# Patient Record
Sex: Female | Born: 1970 | Race: White | Hispanic: No | Marital: Married | Smoking: Never smoker
Health system: Southern US, Community
[De-identification: ages and names within clinical notes are randomized; demographics above are authoritative.]

---

## 1997-11-12 HISTORY — PX: TUBAL LIGATION: SHX77

## 2006-12-13 HISTORY — PX: KNEE SURGERY: SHX244

## 2010-07-28 ENCOUNTER — Ambulatory Visit: Payer: Self-pay | Admitting: Family Medicine

## 2010-07-28 DIAGNOSIS — F39 Unspecified mood [affective] disorder: Secondary | ICD-10-CM | POA: Insufficient documentation

## 2010-07-28 DIAGNOSIS — R634 Abnormal weight loss: Secondary | ICD-10-CM

## 2010-08-18 ENCOUNTER — Ambulatory Visit: Payer: Self-pay | Admitting: Family Medicine

## 2010-08-18 ENCOUNTER — Other Ambulatory Visit: Admission: RE | Admit: 2010-08-18 | Discharge: 2010-08-18 | Payer: Self-pay | Admitting: Family Medicine

## 2010-08-27 LAB — CONVERTED CEMR LAB
Pap Smear: NEGATIVE
Pap Smear: NORMAL

## 2010-11-02 ENCOUNTER — Telehealth: Payer: Self-pay | Admitting: Family Medicine

## 2011-01-12 NOTE — Progress Notes (Signed)
Summary: Meds  Phone Note Call from Patient Call back at Home Phone [phone]   Caller: Patient Call For: Nani Gasser MD Summary of Call: Pt calls and would like a call back in regards to her refills on meds.  Initial call taken by: Kathlene November LPN,  November 02, 2010 9:04 AM  Follow-up for Phone Call        spoke with pt Follow-up by: Avon Gully CMA, Duncan Dull),  November 02, 2010 11:27 AM

## 2011-01-12 NOTE — Letter (Signed)
Summary: Depression Questionnaire  Depression Questionnaire   Imported By: Lanelle Bal 08/04/2010 11:29:14  _____________________________________________________________________  External Attachment:    Type:   Image     Comment:   External Document
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
## 2011-01-12 NOTE — Assessment & Plan Note (Signed)
Summary: CPE, f/u mood   Vital Signs:  Patient profile:   40 year old female Height:      63 inches Weight:      144 pounds Pulse rate:   102 / minute BP sitting:   143 / 95  (right arm) Cuff size:   regular  Vitals Entered By: Avon Gully CMA, Duncan Dull) (August 18, 2010 4:06 PM) CC: f/u mood,pap   CC:  f/u mood and pap.  History of Present Illness: citalopram is working well now. Will stay with current dose. Felt starnge the first week but that resolved.  Has had some abnormal paps but usually repeat paps are normal. Occ cervical polyp but these have been benign too.   Current Medications (verified): 1)  Citalopram Hydrobromide 20 Mg Tabs (Citalopram Hydrobromide) .... 1/2 Tab By [employer] Daily For One Week, Thenincrease To Whole Tab Daily  Allergies (verified): No Known Drug Allergies  Comments:  Nurse/Medical Assistant: The patient's medications and allergies were reviewed with the patient and were updated in the Medication and Allergy Lists. Avon Gully CMA, Duncan Dull) (August 18, 2010 4:07 PM)  Past History:  Past Medical History: Last updated: 07/28/2010 None  Past Surgical History: Last updated: 07/28/2010 c/sec 04/1992 Tubal ligation 11/1997 Left ACL  05/2007  Family History: Reviewed history from 07/28/2010 and no changes required. 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  Lives with her son and daughter.   Never Smoked Alcohol use-no Drug use-no Regular exercise-yes, 1 hour a day 2 caffeinated drinks per day   Review of Systems  The patient denies anorexia, fever, weight loss, weight gain, vision loss, decreased hearing, hoarseness, chest pain, syncope, dyspnea on exertion, peripheral edema, prolonged cough, headaches, hemoptysis, abdominal pain, melena, hematochezia, severe indigestion/heartburn, hematuria, incontinence, genital sores, muscle weakness,  suspicious skin lesions, transient blindness, difficulty walking, depression, unusual weight change, abnormal bleeding, enlarged lymph nodes, and breast masses.    Physical Exam  General:  Well-developed,well-nourished,in no acute distress; alert,appropriate and cooperative throughout examination Head:  Normocephalic and atraumatic without obvious abnormalities. No apparent alopecia or balding. Eyes:  No corneal or conjunctival inflammation noted. EOMI. Perrla.  Ears:  External ear exam shows no significant lesions or deformities.  Otoscopic examination reveals clear canals, tympanic membranes are intact bilaterally without bulging, retraction, inflammation or discharge. Hearing is grossly normal bilaterally. Nose:  External nasal examination shows no deformity or inflammation. Nasal mucosa are pink and moist without lesions or exudates. Mouth:  Oral mucosa and oropharynx without lesions or exudates.  Teeth in good repair. Neck:  No deformities, masses, or tenderness noted. Borderline enlarged thyroid.  Chest Wall:  No deformities, masses, or tenderness noted. Breasts:  No mass, nodules, thickening, tenderness, bulging, retraction, inflamation, nipple discharge or skin changes noted.   Lungs:  Normal respiratory effort, chest expands symmetrically. Lungs are clear to auscultation, no crackles or wheezes. Heart:  Normal rate and regular rhythm. S1 and S2 normal without gallop, murmur, click, rub or other extra sounds. Abdomen:  Bowel sounds positive,abdomen soft and non-tender without masses, organomegaly or hernias noted. Genitalia:  Normal introitus for age, no external lesions, no vaginal discharge, mucosa pink and moist, no vaginal or cervical lesions, no vaginal atrophy, no friaility or hemorrhage, normal uterus size and position, no adnexal masses or tenderness Msk:  No deformity or scoliosis noted of thoracic or lumbar spine.   Pulses:  R and L carotid,radial ,  dorsalis pedis and posterior  tibial pulses are full and equal bilaterally Extremities:  No clubbing, cyanosis, edema, or deformity noted with normal full range of motion of all joints.   Neurologic:  No cranial nerve deficits noted. Station and gait are normal. DTRs are symmetrical throughout. Sensory, motor and coordinative functions appear intact. Skin:  no rashes.   Cervical Nodes:  No lymphadenopathy noted Axillary Nodes:  No palpable lymphadenopathy Psych:  Cognition and judgment appear intact. Alert and cooperative with normal attention span and concentration. No apparent delusions, illusions, hallucinations   Impression & Recommendations:  Problem # 1:  HEALTH MAINTENANCE EXAM (ICD-V70.0) Here for CPE today. Exam is normal Will f/u pap results.   Tdap is up to date. She will hold off on the flu shot today.  Encuraged regulare exercise and calcium supplementation.  Orders: T-Comprehensive Metabolic Panel [id number]) T-Lipid Profile ([id number]) T-TSH [id number])  Problem # 2:  MOOD DISORDER (ICD-296.90) PHQ-9 score was 1, and GAD-7 score is 1.  Thus she is in remission and doing well. Rec stay on the medication fo r6-12 monts.  F/U in 3-4 months Orders: T-TSH ([id number])  Complete Medication List: 1)  Citalopram Hydrobromide 20 Mg Tabs (Citalopram hydrobromide) .... 1/2 tab by mout daily for one week, thenincrease to whole tab daily  Patient Instructions: 1)  Go fasting for your labs. They are open Mon-Fri 8AM-5PM. We will call with the results 2)  We will call you in one week for your pap results 3)  Can schedule a nurse appointment when you are ready for your flu shot.  4)  It is important that you exercise reguarly at least 20 minutes 5 times a week. If you develop chest pain, have severe difficulty breathing, or feel very tired, stop exercising immediately and seek medical attention.  5)  Take calcium +vitamin D daily.   TD Result Date:  12/13/2001 TD Result:  given TD Next Due:  10 yr

## 2011-01-12 NOTE — Letter (Signed)
Summary: Anxiety & Depression Questionnaire  Anxiety & Depression Questionnaire   Imported By: Lanelle Bal 09/01/2010 13:46:52  _____________________________________________________________________  External Attachment:    Type:   Image     Comment:   External Document

## 2011-03-13 ENCOUNTER — Other Ambulatory Visit: Payer: Self-pay | Admitting: Family Medicine

## 2011-03-17 ENCOUNTER — Encounter: Payer: Self-pay | Admitting: Family Medicine

## 2011-03-18 ENCOUNTER — Ambulatory Visit: Payer: Self-pay | Admitting: Family Medicine

## 2011-05-31 ENCOUNTER — Encounter: Payer: Self-pay | Admitting: Family Medicine

## 2011-05-31 ENCOUNTER — Other Ambulatory Visit: Payer: Self-pay | Admitting: Family Medicine

## 2011-05-31 ENCOUNTER — Ambulatory Visit (INDEPENDENT_AMBULATORY_CARE_PROVIDER_SITE_OTHER): Payer: Self-pay | Admitting: Family Medicine

## 2011-05-31 DIAGNOSIS — F39 Unspecified mood [affective] disorder: Secondary | ICD-10-CM

## 2011-05-31 DIAGNOSIS — E663 Overweight: Secondary | ICD-10-CM

## 2011-05-31 DIAGNOSIS — R634 Abnormal weight loss: Secondary | ICD-10-CM

## 2011-05-31 DIAGNOSIS — R5381 Other malaise: Secondary | ICD-10-CM

## 2011-05-31 DIAGNOSIS — R5383 Other fatigue: Secondary | ICD-10-CM

## 2011-05-31 DIAGNOSIS — Z1231 Encounter for screening mammogram for malignant neoplasm of breast: Secondary | ICD-10-CM

## 2011-05-31 NOTE — Assessment & Plan Note (Signed)
Will refer for nutrition counseling

## 2011-05-31 NOTE — Progress Notes (Signed)
  Subjective:    Patient ID: Lorraine Ayers, female    DOB: [birth year], 40 y.o.   MRN: [id number]  HPI Feels really fatigued. Feels like could put her head down and she could very easily fall asleep. Was on vacation last week but felt exhausted.   Stopped her celexa abruptly about 10 days ago. Seh feels like her head is in a fishbowl.  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  Has gained weight ans she is not happy about this.  Too tired too exercise. Has noticed this for a coupel of months. No other changes. Has been a single mom for the last 6 years.  Will talk to a counselor when she feels overwhelmed.  Has had some easy bruising.  Did well on teh celexa overall. She thinks she would like to continue it for now. She noticed she has gained some weight. No chest pain or shortness of breath or change in bowels. No recent changes in her medication except for cessation of the Celexa. She has not been exercising regularly for the last 2 months. She mentioned she is also interested in seeing a nutritionist as well.    Review of Systems     Objective:   Physical Exam  Constitutional: She is oriented to person, place, and time. She appears well-developed and well-nourished.  HENT:  Head: Normocephalic and atraumatic.  Neck: Normal range of motion. Neck supple. No thyromegaly present.  Cardiovascular: Normal rate, regular rhythm and normal heart sounds.   Pulmonary/Chest: Effort normal and breath sounds normal.  Abdominal: Soft. Bowel sounds are normal. She exhibits no distension and no mass. There is no tenderness. There is no rebound and no guarding.  Lymphadenopathy:    She has no cervical adenopathy.  Neurological: She is alert and oriented to person, place, and time.  Skin: Skin is warm and dry.  Psychiatric: She has a normal mood and affect. Her behavior is normal. Judgment and thought content normal.           Assessment & Plan:  Fatigue - will check labs to mak sure no anemia. She really feels her mood is in a good place. Will check her thyroid to rule out hypothyroid.  BP is up today as well.   She would also like to be scheduled for mammogram so that it weren't for her to have that done downstairs.

## 2011-05-31 NOTE — Assessment & Plan Note (Signed)
Certainly some of her symptoms could be from abruptly stopping her Celexa. I discussed that next time we do need to wean the medication. Because she is getting ready to start a stressful job discussed different options. She would like to restart the medication and in approximately 6 months if she's doing well with her new job, she would like to try tapering off. Some of the lightheadedness that she complains about is likely from stopping the Celexa and if she restarts in the next week or 2 this should improve.

## 2011-06-01 ENCOUNTER — Telehealth: Payer: Self-pay | Admitting: Family Medicine

## 2011-06-01 LAB — CBC WITH DIFFERENTIAL/PLATELET
Basophils Absolute: 0 10*3/uL (ref 0.0–0.1)
HCT: 37.5 % (ref 36.0–46.0)
Hemoglobin: 11.5 g/dL — ABNORMAL LOW (ref 12.0–15.0)
Lymphocytes Relative: 24 % (ref 12–46)
Lymphs Abs: 1.6 10*3/uL (ref 0.7–4.0)
Monocytes Absolute: 0.4 10*3/uL (ref 0.1–1.0)
Monocytes Relative: 7 % (ref 3–12)
Neutro Abs: 4.7 10*3/uL (ref 1.7–7.7)
RBC: 4.63 MIL/uL (ref 3.87–5.11)
RDW: 14.7 % (ref 11.5–15.5)
WBC: 6.8 10*3/uL (ref 4.0–10.5)

## 2011-06-01 LAB — COMPLETE METABOLIC PANEL WITH GFR
Alkaline Phosphatase: 56 U/L (ref 39–117)
BUN: 14 mg/dL (ref 6–23)
Creat: 0.76 mg/dL (ref 0.50–1.10)
GFR, Est Non African American: >60 mL/min (ref >60)
Glucose, Bld: 94 mg/dL (ref 70–99)
Total Bilirubin: 0.5 mg/dL (ref 0.3–1.2)

## 2011-06-01 LAB — IRON AND TIBC
Iron: 35 ug/dL — ABNORMAL LOW (ref 42–145)
UIBC: 433 ug/dL

## 2011-06-01 LAB — VITAMIN B12: Vitamin B-12: 455 pg/mL (ref 211–911)

## 2011-06-01 NOTE — Telephone Encounter (Signed)
[facility] is called and per Cordelia Pen she will add FE, vit b12 and TIBC.

## 2011-06-01 NOTE — Telephone Encounter (Signed)
Call patient: Thyroid is normal. She is borderline anemic. Please try to call the lab and see if they can add on an iron and vitamin B12 level to her blood work. If not let me know and we may have to send her for a separate withdrawal.

## 2011-06-02 ENCOUNTER — Telehealth: Payer: Self-pay | Admitting: Family Medicine

## 2011-06-02 NOTE — Telephone Encounter (Signed)
Pt informed of her recent lab values.  LMOM.   start OTC iron supplement and repeat labs in 8 weeks.  Take small cup OJ when taking the iron for absorption purposes.  May need to get OTC peri colace if gets constipated.  Try to take twice daily if tolerates. Jarvis Newcomer, LPN Domingo Dimes

## 2011-06-02 NOTE — Telephone Encounter (Signed)
Call patient: Her iron level was low. Recommend starting once a day over-the-counter iron. If she takes this with a small cup of orange juice will increase absorption. She might need to take it with a stool softener if it tends to cause constipation for her. If she can take it twice a day that would be fantastic she cannot tolerate once a day that is fine. Recheck level in 8 weeks. This is most likely cause of some of her low energy and fatigue.

## 2011-06-08 ENCOUNTER — Telehealth: Payer: Self-pay | Admitting: Family Medicine

## 2011-06-08 NOTE — Telephone Encounter (Signed)
Pt called and was seen on 05-31-11.  Wants to know what the next step is with the med regimen??  Please advise. Plan:  Routed to Dr. Marlyne Beards, LPN Domingo Dimes

## 2011-06-09 ENCOUNTER — Telehealth: Payer: Self-pay | Admitting: Family Medicine

## 2011-06-09 NOTE — Telephone Encounter (Signed)
Closed

## 2011-06-09 NOTE — Telephone Encounter (Signed)
Pt made a second call and pt call returned.  Had to [facility] and this time I let pt know Dr. Linford Arnold out of the office until Monday.  Will call her then as soon as Dr. Linford Arnold has a chance to reply. Jarvis Newcomer, LPN Domingo Dimes

## 2011-06-09 NOTE — Telephone Encounter (Signed)
Please contact pt regarding nutrition consultation per Dr. Linford Arnold. If needing further info can contact Dr. Shelah Lewandowsky office.  See referral from them in chart.

## 2011-06-10 NOTE — Telephone Encounter (Signed)
Pt was notified with the following details from Dr. Cathey Endow.  LMOM for the pt. Jarvis Newcomer, LPN Domingo Dimes

## 2011-06-10 NOTE — Telephone Encounter (Signed)
I reviewed pt's chart to see if I could help.  The plan is to treat her iron deficiency for the next 8 wks to see if this helps her fatigue.  I am assuming Dr Judie Petit will want to see her back after this 8 wks to re-eval but if she feels terrible, having mood problems or extreme fatigue, we need to know.  No notes about changing her Citalopram.

## 2011-06-15 ENCOUNTER — Ambulatory Visit
Admission: RE | Admit: 2011-06-15 | Discharge: 2011-06-15 | Disposition: A | Discharge status: Home / Self Care | Payer: Self-pay | Source: Ambulatory Visit | Attending: Family Medicine | Admitting: Family Medicine

## 2011-06-15 DIAGNOSIS — Z1231 Encounter for screening mammogram for malignant neoplasm of breast: Secondary | ICD-10-CM

## 2011-06-17 ENCOUNTER — Encounter: Payer: Self-pay | Admitting: Family Medicine

## 2011-06-17 ENCOUNTER — Ambulatory Visit (INDEPENDENT_AMBULATORY_CARE_PROVIDER_SITE_OTHER): Payer: Federal, State, Local not specified - PPO | Admitting: Family Medicine

## 2011-06-17 DIAGNOSIS — D649 Anemia, unspecified: Secondary | ICD-10-CM

## 2011-06-17 DIAGNOSIS — E663 Overweight: Secondary | ICD-10-CM

## 2011-06-17 NOTE — Patient Instructions (Addendum)
-   Barriers to weight loss:  Excess carb's; poor calorie distribution; inadequate physical activity; inadequate veg intake.   - Eat at least 3 meals and 1-2 snacks per day.  Aim for no more than 5 hours between eating. - Advance planning for meals and snacks is critical.  (Remember that fast food does not have to be all or none; i.e., add veg's to pizza or fast-food sandwich.) - School year:  Quick breakfasts might include oatmeal w/ blueberries; protein shake; sandwich; hard-boiled eggs w/ bagel thins.   - Cooked oatmeal (thick rolled oats in bulk) is best; can be cooked in advance, and reheated.      Consider adding 1/3 cup powdered milk, 1-2 tbsp nuts/seeds, and 1/4 All Bran cereal to each     bowl.   - Obtain twice as many veg's as protein or carbohydrate foods for both lunch and dinner.  Inclusion of protein component will be important to both appetite management and to iron sufficiency.  - Tastes preferences are learned.   - Google Lafonda Mosses, and watch his video.  Also Google Colonel Bald ("The end of overeating").   - Goal:  30 min walk/jog 5 X wk. Record number of minutes of walking, and bring this record to next appt.  - Call to schedule an Aug appt:  [phone].  (Mon, [place], [place])

## 2011-06-17 NOTE — Progress Notes (Signed)
Medical Nutrition Therapy:  Appt start time: 1030 end time:  1130.  Assessment:  Primary concerns today: Weight management and anemia.  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  Usual eating pattern includes 2 meals and multiple afternoon/evening snacks per day.  Everyday foods include 24 oz diet soda (Dr. Reino Kent, Mtn Dew), coffee w/ 1 tbsp half&half and Stevia, pasta, bread, sweets.  Avoided foods include pork, fennel/licorice, cooked cabbage.  24-hr recall: B (6 AM)- 2 c coffee w/ Stevia & cream; L (2 PM)- 2 chili dogs w/ coleslaw & cheese; Snk (9 PM)- nachos bell grande, diet Mtn Dew; Snacks all day included water & 5 Michelob Ultras.  Yesterday was atypical b/c it was July 4th.   Usual physical activity is inconsistent, and includes walking the dogs a couple of miles occasionally.  Lorraine Ayers works in [place], and arrives at work by 7:30 AM, and she gets home by ~8 PM, after driving her 09-WJ daughter to activities.  (Also has a 19-YO son who attends the [employer].) Lorraine Ayers has tried many things for wt loss; what has worked best has been consistent exercise, but she has been very tired recently (discovered anemia recently).  Lorraine Ayers identifies herself as a stress eater and as being "addicted" to carb's.    Progress Towards Goal(s):  In progress.   Nutritional Diagnosis:  NB-2.1 Physical inactivity As related to fatigue and time constraints.  As evidenced by no consistent exercise. NI-5.8.2 Excessive carbohydrate intake As related to starchy foods and sweets.  As evidenced by self-report of high carb intake on most days.    Intervention:  Nutrition education.  Monitoring/Evaluation:  Dietary intake, exercise, and body weight in 1 month.

## 2011-06-18 ENCOUNTER — Telehealth: Payer: Self-pay | Admitting: Family Medicine

## 2011-06-18 NOTE — Telephone Encounter (Signed)
Pt notified by [facility] to repeat mammo in 1 year and the current was normal. Jarvis Newcomer, LPN Domingo Dimes

## 2011-06-18 NOTE — Telephone Encounter (Signed)
Please call patient. Normal mammogram.  Repeat in 1 year.  

## 2011-06-27 ENCOUNTER — Emergency Department (HOSPITAL_COMMUNITY)
Admission: EM | Admit: 2011-06-27 | Discharge: 2011-06-27 | Disposition: A | Discharge status: Home / Self Care | Payer: Federal, State, Local not specified - PPO | Attending: Emergency Medicine | Admitting: Emergency Medicine

## 2011-06-27 DIAGNOSIS — S61209A Unspecified open wound of unspecified finger without damage to nail, initial encounter: Secondary | ICD-10-CM | POA: Insufficient documentation

## 2011-06-27 DIAGNOSIS — W269XXA Contact with unspecified sharp object(s), initial encounter: Secondary | ICD-10-CM | POA: Insufficient documentation

## 2011-07-28 ENCOUNTER — Other Ambulatory Visit: Payer: Self-pay | Admitting: Family Medicine

## 2012-01-11 ENCOUNTER — Other Ambulatory Visit: Payer: Self-pay | Admitting: Family Medicine

## 2012-03-20 ENCOUNTER — Other Ambulatory Visit: Payer: Self-pay | Admitting: *Deleted

## 2012-03-20 MED ORDER — CITALOPRAM HYDROBROMIDE 20 MG PO TABS: 20.0000 mg | ORAL_TABLET | Freq: Every day | ORAL | Status: DC

## 2012-08-31 ENCOUNTER — Other Ambulatory Visit: Payer: Self-pay | Admitting: Family Medicine

## 2012-10-31 ENCOUNTER — Encounter: Payer: Self-pay | Admitting: Family Medicine

## 2012-10-31 ENCOUNTER — Ambulatory Visit (INDEPENDENT_AMBULATORY_CARE_PROVIDER_SITE_OTHER): Payer: Federal, State, Local not specified - PPO | Admitting: Family Medicine

## 2012-10-31 VITALS — BP 159/97 | HR 87 | Temp 98.3°F | Wt 161.0 lb

## 2012-10-31 DIAGNOSIS — N390 Urinary tract infection, site not specified: Secondary | ICD-10-CM

## 2012-10-31 LAB — POCT URINALYSIS DIPSTICK
Ketones, UA: NEGATIVE
Protein, UA: NEGATIVE
Spec Grav, UA: 1.01

## 2012-10-31 MED ORDER — CIPROFLOXACIN HCL 250 MG PO TABS: ORAL_TABLET | ORAL | Status: AC

## 2012-10-31 NOTE — Progress Notes (Signed)
CC: Lorraine Ayers is a 41 y.o. female is here for Urinary Tract Infection   Subjective: HPI:  Patient reports urinary urgency, frequency, and mild low back pain all started on Friday. Originally accompanied by nausea but this has passed. Symptoms seem to be getting worse otherwise on a daily basis. Interventions include over-the-counter AZO with mild improvement of symptoms however only temporarily. Symptoms feel like past urinary tract infections. She correlates urinary tract infections with increased episodes of sexual activity. She denies fevers, chills, vomiting, abdominal pain, pelvic pain, vaginal bleeding, genitourinary complaints otherwise.   Review Of Systems Outlined In HPI  No past medical history on file.   Family History  Problem Relation Age of Onset  . Heart attack Father   . Hyperlipidemia Father   . Hypertension Father   . Alcohol abuse Other      History  Substance Use Topics  . Smoking status: Never Smoker   . Smokeless tobacco: Never Used  . Alcohol Use: 0.6 oz/week    1 Cans of beer per week     Objective: Filed Vitals:   10/31/12 0933  BP: 159/97  Pulse: 87  Temp: 98.3 F (36.8 C)    General: Alert and Oriented, No Acute Distress Lungs: Clear to auscultation bilaterally, no wheezing/ronchi/rales.  Cardiac: Regular rate and rhythm.  Back: No CVA tenderness, no reproducible midline tenderness in the lumbar region Mental Status: No depression, anxiety, nor agitation.   Assessment & Plan: Lorraine Ayers was seen today for urinary tract infection.  Diagnoses and associated orders for this visit:  Uti (lower urinary tract infection) - Urine culture - ciprofloxacin (CIPRO) 250 MG tablet; Take one by mouth twice a day for five days. - Urinalysis Dipstick    Urinalysis suggestive of UTI with moderate blood and leukocytes. Treat empirically with Cipro awaiting culture. Patient inquires about postcoital antibiotics, asked her to await results and  resolution of symptoms before discussing with her PCP.  Return if symptoms worsen or fail to improve.

## 2012-11-03 LAB — URINE CULTURE: Colony Count: >=100000

## 2012-11-13 ENCOUNTER — Encounter: Payer: Self-pay | Admitting: Family Medicine

## 2012-11-13 ENCOUNTER — Ambulatory Visit (INDEPENDENT_AMBULATORY_CARE_PROVIDER_SITE_OTHER): Payer: BC Managed Care – PPO | Admitting: Family Medicine

## 2012-11-13 VITALS — BP 142/100 | HR 74 | Ht 63.0 in | Wt 162.0 lb

## 2012-11-13 DIAGNOSIS — N390 Urinary tract infection, site not specified: Secondary | ICD-10-CM

## 2012-11-13 DIAGNOSIS — R030 Elevated blood-pressure reading, without diagnosis of hypertension: Secondary | ICD-10-CM

## 2012-11-13 MED ORDER — SULFAMETHOXAZOLE-TRIMETHOPRIM 400-80 MG PO TABS: 1.0000 | ORAL_TABLET | Freq: Every day | ORAL | Status: DC | PRN

## 2012-11-13 NOTE — Progress Notes (Signed)
  Subjective:    Patient ID: Lorraine Ayers, female    DOB: 1971-02-19, 41 y.o.   MRN: [id number]  HPI She has a new relationship and she gets UTIs after intercourse. Wants to be able to treat frequent Uti. She gets urgency and dysuria with her sxs.  She is feeling better from the last episode a few weeks ago. No recurrent sxs so far.  No fever.    Review of Systems     Objective:   Physical Exam  Constitutional: She is oriented to person, place, and time. She appears well-developed and well-nourished.  HENT:  Head: Normocephalic and atraumatic.  Neurological: She is alert and oriented to person, place, and time.  Skin: Skin is warm and dry.  Psychiatric: She has a normal mood and affect. Her behavior is normal.          Assessment & Plan:  UTI - Recurrent, postcoital. We discussed different options. She has intercourse about once a week because we discussed taking a low-dose antibiotic right after intercourse. We will start with single strength Bactrim within 2 hours after and worse. We will try this for the next 4-6 months and see how well it is working for her. At some point we could consider going to just treating when she has symptoms.  Elevated BP -I am concerned bc BP was high last time as well.  We discussed low salt diet and regular exercise. Followup in 6-8 weeks to recheck blood pressure. Also work on lowering stress.

## 2012-11-13 NOTE — Patient Instructions (Addendum)
Drinking plan fluids. Empty bladder frequently. Try to avoid holding your bladder. Take the antibiotic within 2 hours after having sex. If he still developed urinary symptoms any need to come in for a urinalysis and culture.

## 2014-05-22 ENCOUNTER — Other Ambulatory Visit (HOSPITAL_COMMUNITY)
Admission: RE | Admit: 2014-05-22 | Discharge: 2014-05-22 | Disposition: A | Discharge status: Home / Self Care | Payer: BC Managed Care – PPO | Source: Ambulatory Visit | Attending: Family Medicine | Admitting: Family Medicine

## 2014-05-22 ENCOUNTER — Encounter: Payer: Self-pay | Admitting: Family Medicine

## 2014-05-22 ENCOUNTER — Ambulatory Visit (INDEPENDENT_AMBULATORY_CARE_PROVIDER_SITE_OTHER): Payer: BC Managed Care – PPO | Admitting: Family Medicine

## 2014-05-22 VITALS — BP 138/79 | HR 80 | Ht 62.6 in | Wt 155.0 lb

## 2014-05-22 DIAGNOSIS — Z124 Encounter for screening for malignant neoplasm of cervix: Secondary | ICD-10-CM

## 2014-05-22 DIAGNOSIS — Z1151 Encounter for screening for human papillomavirus (HPV): Secondary | ICD-10-CM | POA: Insufficient documentation

## 2014-05-22 DIAGNOSIS — H539 Unspecified visual disturbance: Secondary | ICD-10-CM

## 2014-05-22 DIAGNOSIS — Z23 Encounter for immunization: Secondary | ICD-10-CM

## 2014-05-22 DIAGNOSIS — N920 Excessive and frequent menstruation with regular cycle: Secondary | ICD-10-CM

## 2014-05-22 DIAGNOSIS — Z1231 Encounter for screening mammogram for malignant neoplasm of breast: Secondary | ICD-10-CM

## 2014-05-22 DIAGNOSIS — Z0000 Encounter for general adult medical examination without abnormal findings: Secondary | ICD-10-CM

## 2014-05-22 MED ORDER — CITALOPRAM HYDROBROMIDE 20 MG PO TABS: ORAL_TABLET | ORAL | Status: DC

## 2014-05-22 NOTE — Progress Notes (Signed)
Subjective:     Lorraine Ayers is a 43 y.o. female and is here for a comprehensive physical exam. The patient reports problems - 1. more heavy period and more heavy cramping and pain with he rperiod.  Occ will skip her period.  .  2.  Says has noticed some vision changes esp when reading on the computer. Will see squiggles in her vision. Has one caffeine drink per day.   3.  She's also been under a lot more stress recently. 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 She's also helping take care of her mother who has developed dementia.  She was previously on citalopram for mood and tolerated well without any side effects. She would like to consider restarting it.  History   Social History  . Marital Status: Single    Spouse Name: N/A    Number of Children: N/A  . Years of Education: N/A   Occupational History  . Not on file.   Social History Main Topics  . Smoking status: Never Smoker   . Smokeless tobacco: Never Used  . Alcohol Use: 0.6 oz/week    1 Cans of beer per week  . Drug Use: No  . Sexual Activity:    Other Topics Concern  . Not on file   Social History Narrative  . No narrative on file   Health Maintenance  Topic Date Due  . Tetanus/tdap  12/14/2011  . Pap Smear  08/18/2013  . Influenza Vaccine  07/13/2014    The following portions of the patient's history were reviewed and updated as appropriate: allergies, current medications, past family history, past medical history, past social history, past surgical history and problem list.  Review of Systems A comprehensive review of systems was negative.   Objective:    There were no vitals taken for this visit. General appearance: alert, cooperative and appears stated age Head: Normocephalic, without obvious abnormality, atraumatic Eyes: conje clear, EOMI, PEERLA Ears: normal TM's and external ear canals both ears Nose: Nares normal. Septum midline. Mucosa normal. No drainage or sinus  tenderness. Throat: lips, mucosa, and tongue normal; teeth and gums normal Neck: no adenopathy, no carotid bruit, no JVD, supple, symmetrical, trachea midline and thyroid not enlarged, symmetric, no tenderness/mass/nodules Back: symmetric, no curvature. ROM normal. No CVA tenderness. Lungs: clear to auscultation bilaterally Breasts: normal appearance, no masses or tenderness Heart: regular rate and rhythm, S1, S2 normal, no murmur, click, rub or gallop Abdomen: soft, non-tender; bowel sounds normal; no masses,  no organomegaly Pelvic: cervix normal in appearance, external genitalia normal, no adnexal masses or tenderness, no cervical motion tenderness, rectovaginal septum normal, uterus normal size, shape, and consistency and vagina normal without discharge Extremities: extremities normal, atraumatic, no cyanosis or edema Pulses: 2+ and symmetric Skin: Skin color, texture, turgor normal. No rashes or lesions Lymph nodes: Cervical, supraclavicular, and axillary nodes normal. Neurologic: Alert and oriented X 3, normal strength and tone. Normal symmetric reflexes. Normal coordination and gait    Assessment:    Healthy female exam.      Plan:     See After Visit Summary for Counseling Recommendations  Keep up a regular exercise program and make sure you are eating a healthy diet Try to eat 4 servings of dairy a day, or if you are lactose intolerant take a calcium with vitamin D daily.  Your vaccines are up to date.  Due mammogram - order placed.   #1 vision changes-recommend referral to  ophthalmology for further evaluation. We'll place a referral and have them contact her.  #2 mood/acute situational stress-she would like to restart her citalopram. We'll send her for prescription to the pharmacy. Followup in 3-4 months to make sure that she's doing well and to adjust dose if needed.  #3-dysmenorrhea-exam is fairly normal today and we'll await Pap smear results. We'll also schedule for  ultrasound of the pelvis to evaluate for other causes such as uterine fibroids. She is not a smoker; candidate for birth control for control of symptoms.

## 2014-05-23 LAB — CYTOLOGY - PAP

## 2014-05-27 NOTE — Progress Notes (Signed)
Quick Note:  Call patient: Your Pap smear is normal. Repeat in 2-3 years. ______ 

## 2014-08-06 ENCOUNTER — Encounter: Payer: Self-pay | Admitting: Physician Assistant

## 2014-08-06 ENCOUNTER — Ambulatory Visit (INDEPENDENT_AMBULATORY_CARE_PROVIDER_SITE_OTHER): Payer: BC Managed Care – PPO

## 2014-08-06 ENCOUNTER — Ambulatory Visit (INDEPENDENT_AMBULATORY_CARE_PROVIDER_SITE_OTHER): Payer: BC Managed Care – PPO | Admitting: Physician Assistant

## 2014-08-06 VITALS — BP 142/86 | HR 95 | Ht 62.6 in | Wt 162.0 lb

## 2014-08-06 DIAGNOSIS — M5030 Other cervical disc degeneration, unspecified cervical region: Secondary | ICD-10-CM

## 2014-08-06 DIAGNOSIS — M542 Cervicalgia: Secondary | ICD-10-CM | POA: Diagnosis not present

## 2014-08-06 DIAGNOSIS — M62838 Other muscle spasm: Secondary | ICD-10-CM | POA: Diagnosis not present

## 2014-08-06 DIAGNOSIS — M5412 Radiculopathy, cervical region: Secondary | ICD-10-CM

## 2014-08-06 MED ORDER — MELOXICAM 15 MG PO TABS: ORAL_TABLET | ORAL | Status: DC

## 2014-08-06 MED ORDER — PREDNISONE 50 MG PO TABS: ORAL_TABLET | ORAL | Status: DC

## 2014-08-06 MED ORDER — TRAMADOL HCL 50 MG PO TABS: ORAL_TABLET | ORAL | Status: DC

## 2014-08-06 MED ORDER — CYCLOBENZAPRINE HCL 10 MG PO TABS: 10.0000 mg | ORAL_TABLET | Freq: Three times a day (TID) | ORAL | Status: DC | PRN

## 2014-08-06 MED ORDER — KETOROLAC TROMETHAMINE 60 MG/2ML IM SOLN
60.0000 mg | Freq: Once | INTRAMUSCULAR | Status: AC
  Administered 2014-08-06: 60 mg via INTRAMUSCULAR

## 2014-08-06 NOTE — Patient Instructions (Signed)
Get xray today.   Start prednisone for 5 days.  Start mobic for 2 weeks.  Muscle relaxer as needed up to three times a day.  Tramadol as needed for acute pain.  ROM exercises.  Heat and Ice alternate.
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
## 2014-08-26 ENCOUNTER — Ambulatory Visit: Payer: BC Managed Care – PPO | Admitting: Family Medicine

## 2014-08-26 DIAGNOSIS — Z0289 Encounter for other administrative examinations: Secondary | ICD-10-CM

## 2014-11-15 ENCOUNTER — Other Ambulatory Visit: Payer: Self-pay | Admitting: Family Medicine

## 2014-11-27 ENCOUNTER — Other Ambulatory Visit: Payer: Self-pay | Admitting: Family Medicine

## 2015-01-05 ENCOUNTER — Other Ambulatory Visit: Payer: Self-pay | Admitting: Family Medicine

## 2015-01-16 ENCOUNTER — Other Ambulatory Visit: Payer: Self-pay | Admitting: Family Medicine

## 2015-01-25 ENCOUNTER — Other Ambulatory Visit: Payer: Self-pay | Admitting: Family Medicine

## 2015-01-27 ENCOUNTER — Ambulatory Visit (INDEPENDENT_AMBULATORY_CARE_PROVIDER_SITE_OTHER): Payer: BC Managed Care – PPO | Admitting: Family Medicine

## 2015-01-27 ENCOUNTER — Encounter: Payer: Self-pay | Admitting: Family Medicine

## 2015-01-27 VITALS — BP 137/80 | HR 78 | Wt 165.0 lb

## 2015-01-27 DIAGNOSIS — F439 Reaction to severe stress, unspecified: Secondary | ICD-10-CM | POA: Diagnosis not present

## 2015-01-27 DIAGNOSIS — F430 Acute stress reaction: Secondary | ICD-10-CM

## 2015-01-27 MED ORDER — CITALOPRAM HYDROBROMIDE 20 MG PO TABS: 20.0000 mg | ORAL_TABLET | Freq: Every day | ORAL | Status: DC

## 2015-01-27 NOTE — Progress Notes (Signed)
   Subjective:    Patient ID: Lorraine Ayers, female    DOB: December 27, 1970, 44 y.o.   MRN: [id number]  HPI  Here to follow-up on anxiety-overall she's doing well. I last saw her about 7 months ago. 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 She is back on the citalopram and doing fantastic. She feels like overall her levels of stress of still been high but she's been managing it well and has felt very happy on the medication which she says is unusual for her in the wintertime. She denies any side effects.   Review of Systems     Objective:   Physical Exam  Constitutional: She is oriented to person, place, and time. She appears well-developed and well-nourished.  HENT:  Head: Normocephalic and atraumatic.  Cardiovascular: Normal rate, regular rhythm and normal heart sounds.   Pulmonary/Chest: Effort normal and breath sounds normal.  Neurological: She is alert and oriented to person, place, and time.  Skin: Skin is warm and dry.  Psychiatric: She has a normal mood and affect. Her behavior is normal.          Assessment & Plan:  Acute stress-gad 7 score of 0 today. She rates her symptoms as not difficult. She would like to continue the medication. Prescriptions sent. Follow-up in 6 months.

## 2015-03-17 ENCOUNTER — Ambulatory Visit (INDEPENDENT_AMBULATORY_CARE_PROVIDER_SITE_OTHER): Payer: BC Managed Care – PPO

## 2015-03-17 ENCOUNTER — Ambulatory Visit (INDEPENDENT_AMBULATORY_CARE_PROVIDER_SITE_OTHER): Payer: BC Managed Care – PPO | Admitting: Family Medicine

## 2015-03-17 ENCOUNTER — Encounter: Payer: Self-pay | Admitting: Family Medicine

## 2015-03-17 VITALS — BP 138/79 | HR 75 | Ht 62.6 in

## 2015-03-17 DIAGNOSIS — R1032 Left lower quadrant pain: Secondary | ICD-10-CM

## 2015-03-17 DIAGNOSIS — M545 Low back pain, unspecified: Secondary | ICD-10-CM

## 2015-03-17 DIAGNOSIS — N760 Acute vaginitis: Secondary | ICD-10-CM

## 2015-03-17 MED ORDER — METAXALONE 800 MG PO TABS: 800.0000 mg | ORAL_TABLET | Freq: Three times a day (TID) | ORAL | Status: DC

## 2015-03-17 NOTE — Patient Instructions (Addendum)
Take Ibuprofen 600mg  every 6 hours. Take with food and water. Stop immediately if any GI upset.      Low Back Sprain with Rehab  A sprain is an injury in which a ligament is torn. The ligaments of the lower back are vulnerable to sprains. However, they are strong and require great force to be injured. These ligaments are important for stabilizing the spinal column. Sprains are classified into three categories. Grade 1 sprains cause pain, but the tendon is not lengthened. Grade 2 sprains include a lengthened ligament, due to the ligament being stretched or partially ruptured. With grade 2 sprains there is still function, although the function may be decreased. Grade 3 sprains involve a complete tear of the tendon or muscle, and function is usually impaired. SYMPTOMS   Severe pain in the lower back.  Sometimes, a feeling of a "pop," "snap," or tear, at the time of injury.  Tenderness and sometimes swelling at the injury site.  Uncommonly, bruising (contusion) within 48 hours of injury.  Muscle spasms in the back. CAUSES  Low back sprains occur when a force is placed on the ligaments that is greater than they can handle. Common causes of injury include:  Performing a stressful act while off-balance.  Repetitive stressful activities that involve movement of the lower back.  Direct hit (trauma) to the lower back. RISK INCREASES WITH:  Contact sports (football, wrestling).  Collisions (major skiing accidents).  Sports that require throwing or lifting (baseball, weightlifting).  Sports involving twisting of the spine (gymnastics, diving, tennis, golf).  Poor strength and flexibility.  Inadequate protection.  Previous back injury or surgery (especially fusion). PREVENTION  Wear properly fitted and padded protective equipment.  Warm up and stretch properly before activity.  Allow for adequate recovery between workouts.  Maintain physical fitness:  Strength, flexibility, and  endurance.  Cardiovascular fitness.  Maintain a healthy body weight. PROGNOSIS  If treated properly, low back sprains usually heal with non-surgical treatment. The length of time for healing depends on the severity of the injury.  RELATED COMPLICATIONS   Recurring symptoms, resulting in a chronic problem.  Chronic inflammation and pain in the low back.  Delayed healing or resolution of symptoms, especially if activity is resumed too soon.  Prolonged impairment.  Unstable or arthritic joints of the low back. TREATMENT  Treatment first involves the use of ice and medicine, to reduce pain and inflammation. The use of strengthening and stretching exercises may help reduce pain with activity. These exercises may be performed at home or with a therapist. Severe injuries may require referral to a therapist for further evaluation and treatment, such as ultrasound. Your caregiver may advise that you wear a back brace or corset, to help reduce pain and discomfort. Often, prolonged bed rest results in greater harm then benefit. Corticosteroid injections may be recommended. However, these should be reserved for the most serious cases. It is important to avoid using your back when lifting objects. At night, sleep on your back on a firm mattress, with a pillow placed under your knees. If non-surgical treatment is unsuccessful, surgery may be needed.  MEDICATION   If pain medicine is needed, nonsteroidal anti-inflammatory medicines (aspirin and ibuprofen), or other minor pain relievers (acetaminophen), are often advised.  Do not take pain medicine for 7 days before surgery.  Prescription pain relievers may be given, if your caregiver thinks they are needed. Use only as directed and only as much as you need.  Ointments applied to the skin  may be helpful.  Corticosteroid injections may be given by your caregiver. These injections should be reserved for the most serious cases, because they may only be  given a certain number of times. HEAT AND COLD  Cold treatment (icing) should be applied for 10 to 15 minutes every 2 to 3 hours for inflammation and pain, and immediately after activity that aggravates your symptoms. Use ice packs or an ice massage.  Heat treatment may be used before performing stretching and strengthening activities prescribed by your caregiver, physical therapist, or athletic trainer. Use a heat pack or a warm water soak. SEEK MEDICAL CARE IF:   Symptoms get worse or do not improve in 2 to 4 weeks, despite treatment.  You develop numbness or weakness in either leg.  You lose bowel or bladder function.  Any of the following occur after surgery: fever, increased pain, swelling, redness, drainage of fluids, or bleeding in the affected area.  New, unexplained symptoms develop. (Drugs used in treatment may produce side effects.) EXERCISES  RANGE OF MOTION (ROM) AND STRETCHING EXERCISES - Low Back Sprain Most people with lower back pain will find that their symptoms get worse with excessive bending forward (flexion) or arching at the lower back (extension). The exercises that will help resolve your symptoms will focus on the opposite motion.  Your physician, physical therapist or athletic trainer will help you determine which exercises will be most helpful to resolve your lower back pain. Do not complete any exercises without first consulting with your caregiver. Discontinue any exercises which make your symptoms worse, until you speak to your caregiver. If you have pain, numbness or tingling which travels down into your buttocks, leg or foot, the goal of the therapy is for these symptoms to move closer to your back and eventually resolve. Sometimes, these leg symptoms will get better, but your lower back pain may worsen. This is often an indication of progress in your rehabilitation. Be very alert to any changes in your symptoms and the activities in which you participated in the  24 hours prior to the change. Sharing this information with your caregiver will allow him or her to most efficiently treat your condition. These exercises may help you when beginning to rehabilitate your injury. Your symptoms may resolve with or without further involvement from your physician, physical therapist or athletic trainer. While completing these exercises, remember:   Restoring tissue flexibility helps normal motion to return to the joints. This allows healthier, less painful movement and activity.  An effective stretch should be held for at least 30 seconds.  A stretch should never be painful. You should only feel a gentle lengthening or release in the stretched tissue. FLEXION RANGE OF MOTION AND STRETCHING EXERCISES: STRETCH - Flexion, Single Knee to Chest   Lie on a firm bed or floor with both legs extended in front of you.  Keeping one leg in contact with the floor, bring your opposite knee to your chest. Hold your leg in place by either grabbing behind your thigh or at your knee.  Pull until you feel a gentle stretch in your low back. Hold __________ seconds.  Slowly release your grasp and repeat the exercise with the opposite side. Repeat __________ times. Complete this exercise __________ times per day.  STRETCH - Flexion, Double Knee to Chest  Lie on a firm bed or floor with both legs extended in front of you.  Keeping one leg in contact with the floor, bring your opposite knee to your  chest.  Tense your stomach muscles to support your back and then lift your other knee to your chest. Hold your legs in place by either grabbing behind your thighs or at your knees.  Pull both knees toward your chest until you feel a gentle stretch in your low back. Hold __________ seconds.  Tense your stomach muscles and slowly return one leg at a time to the floor. Repeat __________ times. Complete this exercise __________ times per day.  STRETCH - Low Trunk Rotation  Lie on a firm  bed or floor. Keeping your legs in front of you, bend your knees so they are both pointed toward the ceiling and your feet are flat on the floor.  Extend your arms out to the side. This will stabilize your upper body by keeping your shoulders in contact with the floor.  Gently and slowly drop both knees together to one side until you feel a gentle stretch in your low back. Hold for __________ seconds.  Tense your stomach muscles to support your lower back as you bring your knees back to the starting position. Repeat the exercise to the other side. Repeat __________ times. Complete this exercise __________ times per day  EXTENSION RANGE OF MOTION AND FLEXIBILITY EXERCISES: STRETCH - Extension, Prone on Elbows   Lie on your stomach on the floor, a bed will be too soft. Place your palms about shoulder width apart and at the height of your head.  Place your elbows under your shoulders. If this is too painful, stack pillows under your chest.  Allow your body to relax so that your hips drop lower and make contact more completely with the floor.  Hold this position for __________ seconds.  Slowly return to lying flat on the floor. Repeat __________ times. Complete this exercise __________ times per day.  RANGE OF MOTION - Extension, Prone Press Ups  Lie on your stomach on the floor, a bed will be too soft. Place your palms about shoulder width apart and at the height of your head.  Keeping your back as relaxed as possible, slowly straighten your elbows while keeping your hips on the floor. You may adjust the placement of your hands to maximize your comfort. As you gain motion, your hands will come more underneath your shoulders.  Hold this position __________ seconds.  Slowly return to lying flat on the floor. Repeat __________ times. Complete this exercise __________ times per day.  RANGE OF MOTION- Quadruped, Neutral Spine   Assume a hands and knees position on a firm surface. Keep your  hands under your shoulders and your knees under your hips. You may place padding under your knees for comfort.  Drop your head and point your tailbone toward the ground below you. This will round out your lower back like an angry cat. Hold this position for __________ seconds.  Slowly lift your head and release your tail bone so that your back sags into a large arch, like an old horse.  Hold this position for __________ seconds.  Repeat this until you feel limber in your low back.  Now, find your "sweet spot." This will be the most comfortable position somewhere between the two previous positions. This is your neutral spine. Once you have found this position, tense your stomach muscles to support your low back.  Hold this position for __________ seconds. Repeat __________ times. Complete this exercise __________ times per day.  STRENGTHENING EXERCISES - Low Back Sprain These exercises may help you when beginning to  rehabilitate your injury. These exercises should be done near your "sweet spot." This is the neutral, low-back arch, somewhere between fully rounded and fully arched, that is your least painful position. When performed in this safe range of motion, these exercises can be used for people who have either a flexion or extension based injury. These exercises may resolve your symptoms with or without further involvement from your physician, physical therapist or athletic trainer. While completing these exercises, remember:   Muscles can gain both the endurance and the strength needed for everyday activities through controlled exercises.  Complete these exercises as instructed by your physician, physical therapist or athletic trainer. Increase the resistance and repetitions only as guided.  You may experience muscle soreness or fatigue, but the pain or discomfort you are trying to eliminate should never worsen during these exercises. If this pain does worsen, stop and make certain you are  following the directions exactly. If the pain is still present after adjustments, discontinue the exercise until you can discuss the trouble with your caregiver. STRENGTHENING - Deep Abdominals, Pelvic Tilt   Lie on a firm bed or floor. Keeping your legs in front of you, bend your knees so they are both pointed toward the ceiling and your feet are flat on the floor.  Tense your lower abdominal muscles to press your low back into the floor. This motion will rotate your pelvis so that your tail bone is scooping upwards rather than pointing at your feet or into the floor. With a gentle tension and even breathing, hold this position for __________ seconds. Repeat __________ times. Complete this exercise __________ times per day.  STRENGTHENING - Abdominals, Crunches   Lie on a firm bed or floor. Keeping your legs in front of you, bend your knees so they are both pointed toward the ceiling and your feet are flat on the floor. Cross your arms over your chest.  Slightly tip your chin down without bending your neck.  Tense your abdominals and slowly lift your trunk high enough to just clear your shoulder blades. Lifting higher can put excessive stress on the lower back and does not further strengthen your abdominal muscles.  Control your return to the starting position. Repeat __________ times. Complete this exercise __________ times per day.  STRENGTHENING - Quadruped, Opposite UE/LE Lift   Assume a hands and knees position on a firm surface. Keep your hands under your shoulders and your knees under your hips. You may place padding under your knees for comfort.  Find your neutral spine and gently tense your abdominal muscles so that you can maintain this position. Your shoulders and hips should form a rectangle that is parallel with the floor and is not twisted.  Keeping your trunk steady, lift your right hand no higher than your shoulder and then your left leg no higher than your hip. Make sure you  are not holding your breath. Hold this position for __________ seconds.  Continuing to keep your abdominal muscles tense and your back steady, slowly return to your starting position. Repeat with the opposite arm and leg. Repeat __________ times. Complete this exercise __________ times per day.  STRENGTHENING - Abdominals and Quadriceps, Straight Leg Raise   Lie on a firm bed or floor with both legs extended in front of you.  Keeping one leg in contact with the floor, bend the other knee so that your foot can rest flat on the floor.  Find your neutral spine, and tense your abdominal muscles to  maintain your spinal position throughout the exercise.  Slowly lift your straight leg off the floor about 6 inches for a count of 15, making sure to not hold your breath.  Still keeping your neutral spine, slowly lower your leg all the way to the floor. Repeat this exercise with each leg __________ times. Complete this exercise __________ times per day. POSTURE AND BODY MECHANICS CONSIDERATIONS - Low Back Sprain Keeping correct posture when sitting, standing or completing your activities will reduce the stress put on different body tissues, allowing injured tissues a chance to heal and limiting painful experiences. The following are general guidelines for improved posture. Your physician or physical therapist will provide you with any instructions specific to your needs. While reading these guidelines, remember:  The exercises prescribed by your provider will help you have the flexibility and strength to maintain correct postures.  The correct posture provides the best environment for your joints to work. All of your joints have less wear and tear when properly supported by a spine with good posture. This means you will experience a healthier, less painful body.  Correct posture must be practiced with all of your activities, especially prolonged sitting and standing. Correct posture is as important when  doing repetitive low-stress activities (typing) as it is when doing a single heavy-load activity (lifting). RESTING POSITIONS Consider which positions are most painful for you when choosing a resting position. If you have pain with flexion-based activities (sitting, bending, stooping, squatting), choose a position that allows you to rest in a less flexed posture. You would want to avoid curling into a fetal position on your side. If your pain worsens with extension-based activities (prolonged standing, working overhead), avoid resting in an extended position such as sleeping on your stomach. Most people will find more comfort when they rest with their spine in a more neutral position, neither too rounded nor too arched. Lying on a non-sagging bed on your side with a pillow between your knees, or on your back with a pillow under your knees will often provide some relief. Keep in mind, being in any one position for a prolonged period of time, no matter how correct your posture, can still lead to stiffness. PROPER SITTING POSTURE In order to minimize stress and discomfort on your spine, you must sit with correct posture. Sitting with good posture should be effortless for a healthy body. Returning to good posture is a gradual process. Many people can work toward this most comfortably by using various supports until they have the flexibility and strength to maintain this posture on their own. When sitting with proper posture, your ears will fall over your shoulders and your shoulders will fall over your hips. You should use the back of the chair to support your upper back. Your lower back will be in a neutral position, just slightly arched. You may place a small pillow or folded towel at the base of your lower back for  support.  When working at a desk, create an environment that supports good, upright posture. Without extra support, muscles tire, which leads to excessive strain on joints and other tissues. Keep  these recommendations in mind: CHAIR:  A chair should be able to slide under your desk when your back makes contact with the back of the chair. This allows you to work closely.  The chair's height should allow your eyes to be level with the upper part of your monitor and your hands to be slightly lower than your elbows. BODY  POSITION  Your feet should make contact with the floor. If this is not possible, use a foot rest.  Keep your ears over your shoulders. This will reduce stress on your neck and low back. INCORRECT SITTING POSTURES  If you are feeling tired and unable to assume a healthy sitting posture, do not slouch or slump. This puts excessive strain on your back tissues, causing more damage and pain. Healthier options include:  Using more support, like a lumbar pillow.  Switching tasks to something that requires you to be upright or walking.  Talking a brief walk.  Lying down to rest in a neutral-spine position. PROLONGED STANDING WHILE SLIGHTLY LEANING FORWARD  When completing a task that requires you to lean forward while standing in one place for a long time, place either foot up on a stationary 2-4 inch high object to help maintain the best posture. When both feet are on the ground, the lower back tends to lose its slight inward curve. If this curve flattens (or becomes too large), then the back and your other joints will experience too much stress, tire more quickly, and can cause pain. CORRECT STANDING POSTURES Proper standing posture should be assumed with all daily activities, even if they only take a few moments, like when brushing your teeth. As in sitting, your ears should fall over your shoulders and your shoulders should fall over your hips. You should keep a slight tension in your abdominal muscles to brace your spine. Your tailbone should point down to the ground, not behind your body, resulting in an over-extended swayback posture.  INCORRECT STANDING POSTURES  Common  incorrect standing postures include a forward head, locked knees and/or an excessive swayback. WALKING Walk with an upright posture. Your ears, shoulders and hips should all line-up. PROLONGED ACTIVITY IN A FLEXED POSITION When completing a task that requires you to bend forward at your waist or lean over a low surface, try to find a way to stabilize 3 out of 4 of your limbs. You can place a hand or elbow on your thigh or rest a knee on the surface you are reaching across. This will provide you more stability, so that your muscles do not tire as quickly. By keeping your knees relaxed, or slightly bent, you will also reduce stress across your lower back. CORRECT LIFTING TECHNIQUES DO :  Assume a wide stance. This will provide you more stability and the opportunity to get as close as possible to the object which you are lifting.  Tense your abdominals to brace your spine. Bend at the knees and hips. Keeping your back locked in a neutral-spine position, lift using your leg muscles. Lift with your legs, keeping your back straight.  Test the weight of unknown objects before attempting to lift them.  Try to keep your elbows locked down at your sides in order get the best strength from your shoulders when carrying an object.  Always ask for help when lifting heavy or awkward objects. INCORRECT LIFTING TECHNIQUES DO NOT:   Lock your knees when lifting, even if it is a small object.  Bend and twist. Pivot at your feet or move your feet when needing to change directions.  Assume that you can safely pick up even a paperclip without proper posture. Document Released: 11/29/2005 Document Revised: 02/21/2012 Document Reviewed: 03/13/2009 [facility] Patient Information 2015 [place], [place]. This information is not intended to replace advice given to you by your health care provider. Make sure you discuss any questions you  have with your health care provider. Low Back Sprain with Rehab  A sprain is an  injury in which a ligament is torn. The ligaments of the lower back are vulnerable to sprains. However, they are strong and require great force to be injured. These ligaments are important for stabilizing the spinal column. Sprains are classified into three categories. Grade 1 sprains cause pain, but the tendon is not lengthened. Grade 2 sprains include a lengthened ligament, due to the ligament being stretched or partially ruptured. With grade 2 sprains there is still function, although the function may be decreased. Grade 3 sprains involve a complete tear of the tendon or muscle, and function is usually impaired. SYMPTOMS   Severe pain in the lower back.  Sometimes, a feeling of a "pop," "snap," or tear, at the time of injury.  Tenderness and sometimes swelling at the injury site.  Uncommonly, bruising (contusion) within 48 hours of injury.  Muscle spasms in the back. CAUSES  Low back sprains occur when a force is placed on the ligaments that is greater than they can handle. Common causes of injury include:  Performing a stressful act while off-balance.  Repetitive stressful activities that involve movement of the lower back.  Direct hit (trauma) to the lower back. RISK INCREASES WITH:  Contact sports (football, wrestling).  Collisions (major skiing accidents).  Sports that require throwing or lifting (baseball, weightlifting).  Sports involving twisting of the spine (gymnastics, diving, tennis, golf).  Poor strength and flexibility.  Inadequate protection.  Previous back injury or surgery (especially fusion). PREVENTION  Wear properly fitted and padded protective equipment.  Warm up and stretch properly before activity.  Allow for adequate recovery between workouts.  Maintain physical fitness:  Strength, flexibility, and endurance.  Cardiovascular fitness.  Maintain a healthy body weight. PROGNOSIS  If treated properly, low back sprains usually heal with  non-surgical treatment. The length of time for healing depends on the severity of the injury.  RELATED COMPLICATIONS   Recurring symptoms, resulting in a chronic problem.  Chronic inflammation and pain in the low back.  Delayed healing or resolution of symptoms, especially if activity is resumed too soon.  Prolonged impairment.  Unstable or arthritic joints of the low back. TREATMENT  Treatment first involves the use of ice and medicine, to reduce pain and inflammation. The use of strengthening and stretching exercises may help reduce pain with activity. These exercises may be performed at home or with a therapist. Severe injuries may require referral to a therapist for further evaluation and treatment, such as ultrasound. Your caregiver may advise that you wear a back brace or corset, to help reduce pain and discomfort. Often, prolonged bed rest results in greater harm then benefit. Corticosteroid injections may be recommended. However, these should be reserved for the most serious cases. It is important to avoid using your back when lifting objects. At night, sleep on your back on a firm mattress, with a pillow placed under your knees. If non-surgical treatment is unsuccessful, surgery may be needed.  MEDICATION   If pain medicine is needed, nonsteroidal anti-inflammatory medicines (aspirin and ibuprofen), or other minor pain relievers (acetaminophen), are often advised.  Do not take pain medicine for 7 days before surgery.  Prescription pain relievers may be given, if your caregiver thinks they are needed. Use only as directed and only as much as you need.  Ointments applied to the skin may be helpful.  Corticosteroid injections may be given by your caregiver. These injections should  be reserved for the most serious cases, because they may only be given a certain number of times. HEAT AND COLD  Cold treatment (icing) should be applied for 10 to 15 minutes every 2 to 3 hours for  inflammation and pain, and immediately after activity that aggravates your symptoms. Use ice packs or an ice massage.  Heat treatment may be used before performing stretching and strengthening activities prescribed by your caregiver, physical therapist, or athletic trainer. Use a heat pack or a warm water soak. SEEK MEDICAL CARE IF:   Symptoms get worse or do not improve in 2 to 4 weeks, despite treatment.  You develop numbness or weakness in either leg.  You lose bowel or bladder function.  Any of the following occur after surgery: fever, increased pain, swelling, redness, drainage of fluids, or bleeding in the affected area.  New, unexplained symptoms develop. (Drugs used in treatment may produce side effects.) EXERCISES  RANGE OF MOTION (ROM) AND STRETCHING EXERCISES - Low Back Sprain Most people with lower back pain will find that their symptoms get worse with excessive bending forward (flexion) or arching at the lower back (extension). The exercises that will help resolve your symptoms will focus on the opposite motion.  Your physician, physical therapist or athletic trainer will help you determine which exercises will be most helpful to resolve your lower back pain. Do not complete any exercises without first consulting with your caregiver. Discontinue any exercises which make your symptoms worse, until you speak to your caregiver. If you have pain, numbness or tingling which travels down into your buttocks, leg or foot, the goal of the therapy is for these symptoms to move closer to your back and eventually resolve. Sometimes, these leg symptoms will get better, but your lower back pain may worsen. This is often an indication of progress in your rehabilitation. Be very alert to any changes in your symptoms and the activities in which you participated in the 24 hours prior to the change. Sharing this information with your caregiver will allow him or her to most efficiently treat your  condition. These exercises may help you when beginning to rehabilitate your injury. Your symptoms may resolve with or without further involvement from your physician, physical therapist or athletic trainer. While completing these exercises, remember:   Restoring tissue flexibility helps normal motion to return to the joints. This allows healthier, less painful movement and activity.  An effective stretch should be held for at least 30 seconds.  A stretch should never be painful. You should only feel a gentle lengthening or release in the stretched tissue. FLEXION RANGE OF MOTION AND STRETCHING EXERCISES: STRETCH - Flexion, Single Knee to Chest   Lie on a firm bed or floor with both legs extended in front of you.  Keeping one leg in contact with the floor, bring your opposite knee to your chest. Hold your leg in place by either grabbing behind your thigh or at your knee.  Pull until you feel a gentle stretch in your low back. Hold __________ seconds.  Slowly release your grasp and repeat the exercise with the opposite side. Repeat __________ times. Complete this exercise __________ times per day.  STRETCH - Flexion, Double Knee to Chest  Lie on a firm bed or floor with both legs extended in front of you.  Keeping one leg in contact with the floor, bring your opposite knee to your chest.  Tense your stomach muscles to support your back and then lift your other  knee to your chest. Hold your legs in place by either grabbing behind your thighs or at your knees.  Pull both knees toward your chest until you feel a gentle stretch in your low back. Hold __________ seconds.  Tense your stomach muscles and slowly return one leg at a time to the floor. Repeat __________ times. Complete this exercise __________ times per day.  STRETCH - Low Trunk Rotation  Lie on a firm bed or floor. Keeping your legs in front of you, bend your knees so they are both pointed toward the ceiling and your feet are  flat on the floor.  Extend your arms out to the side. This will stabilize your upper body by keeping your shoulders in contact with the floor.  Gently and slowly drop both knees together to one side until you feel a gentle stretch in your low back. Hold for __________ seconds.  Tense your stomach muscles to support your lower back as you bring your knees back to the starting position. Repeat the exercise to the other side. Repeat __________ times. Complete this exercise __________ times per day  EXTENSION RANGE OF MOTION AND FLEXIBILITY EXERCISES: STRETCH - Extension, Prone on Elbows   Lie on your stomach on the floor, a bed will be too soft. Place your palms about shoulder width apart and at the height of your head.  Place your elbows under your shoulders. If this is too painful, stack pillows under your chest.  Allow your body to relax so that your hips drop lower and make contact more completely with the floor.  Hold this position for __________ seconds.  Slowly return to lying flat on the floor. Repeat __________ times. Complete this exercise __________ times per day.  RANGE OF MOTION - Extension, Prone Press Ups  Lie on your stomach on the floor, a bed will be too soft. Place your palms about shoulder width apart and at the height of your head.  Keeping your back as relaxed as possible, slowly straighten your elbows while keeping your hips on the floor. You may adjust the placement of your hands to maximize your comfort. As you gain motion, your hands will come more underneath your shoulders.  Hold this position __________ seconds.  Slowly return to lying flat on the floor. Repeat __________ times. Complete this exercise __________ times per day.  RANGE OF MOTION- Quadruped, Neutral Spine   Assume a hands and knees position on a firm surface. Keep your hands under your shoulders and your knees under your hips. You may place padding under your knees for comfort.  Drop your head  and point your tailbone toward the ground below you. This will round out your lower back like an angry cat. Hold this position for __________ seconds.  Slowly lift your head and release your tail bone so that your back sags into a large arch, like an old horse.  Hold this position for __________ seconds.  Repeat this until you feel limber in your low back.  Now, find your "sweet spot." This will be the most comfortable position somewhere between the two previous positions. This is your neutral spine. Once you have found this position, tense your stomach muscles to support your low back.  Hold this position for __________ seconds. Repeat __________ times. Complete this exercise __________ times per day.  STRENGTHENING EXERCISES - Low Back Sprain These exercises may help you when beginning to rehabilitate your injury. These exercises should be done near your "sweet spot." This is the  neutral, low-back arch, somewhere between fully rounded and fully arched, that is your least painful position. When performed in this safe range of motion, these exercises can be used for people who have either a flexion or extension based injury. These exercises may resolve your symptoms with or without further involvement from your physician, physical therapist or athletic trainer. While completing these exercises, remember:   Muscles can gain both the endurance and the strength needed for everyday activities through controlled exercises.  Complete these exercises as instructed by your physician, physical therapist or athletic trainer. Increase the resistance and repetitions only as guided.  You may experience muscle soreness or fatigue, but the pain or discomfort you are trying to eliminate should never worsen during these exercises. If this pain does worsen, stop and make certain you are following the directions exactly. If the pain is still present after adjustments, discontinue the exercise until you can discuss  the trouble with your caregiver. STRENGTHENING - Deep Abdominals, Pelvic Tilt   Lie on a firm bed or floor. Keeping your legs in front of you, bend your knees so they are both pointed toward the ceiling and your feet are flat on the floor.  Tense your lower abdominal muscles to press your low back into the floor. This motion will rotate your pelvis so that your tail bone is scooping upwards rather than pointing at your feet or into the floor. With a gentle tension and even breathing, hold this position for __________ seconds. Repeat __________ times. Complete this exercise __________ times per day.  STRENGTHENING - Abdominals, Crunches   Lie on a firm bed or floor. Keeping your legs in front of you, bend your knees so they are both pointed toward the ceiling and your feet are flat on the floor. Cross your arms over your chest.  Slightly tip your chin down without bending your neck.  Tense your abdominals and slowly lift your trunk high enough to just clear your shoulder blades. Lifting higher can put excessive stress on the lower back and does not further strengthen your abdominal muscles.  Control your return to the starting position. Repeat __________ times. Complete this exercise __________ times per day.  STRENGTHENING - Quadruped, Opposite UE/LE Lift   Assume a hands and knees position on a firm surface. Keep your hands under your shoulders and your knees under your hips. You may place padding under your knees for comfort.  Find your neutral spine and gently tense your abdominal muscles so that you can maintain this position. Your shoulders and hips should form a rectangle that is parallel with the floor and is not twisted.  Keeping your trunk steady, lift your right hand no higher than your shoulder and then your left leg no higher than your hip. Make sure you are not holding your breath. Hold this position for __________ seconds.  Continuing to keep your abdominal muscles tense and  your back steady, slowly return to your starting position. Repeat with the opposite arm and leg. Repeat __________ times. Complete this exercise __________ times per day.  STRENGTHENING - Abdominals and Quadriceps, Straight Leg Raise   Lie on a firm bed or floor with both legs extended in front of you.  Keeping one leg in contact with the floor, bend the other knee so that your foot can rest flat on the floor.  Find your neutral spine, and tense your abdominal muscles to maintain your spinal position throughout the exercise.  Slowly lift your straight leg off the  floor about 6 inches for a count of 15, making sure to not hold your breath.  Still keeping your neutral spine, slowly lower your leg all the way to the floor. Repeat this exercise with each leg __________ times. Complete this exercise __________ times per day. POSTURE AND BODY MECHANICS CONSIDERATIONS - Low Back Sprain Keeping correct posture when sitting, standing or completing your activities will reduce the stress put on different body tissues, allowing injured tissues a chance to heal and limiting painful experiences. The following are general guidelines for improved posture. Your physician or physical therapist will provide you with any instructions specific to your needs. While reading these guidelines, remember:  The exercises prescribed by your provider will help you have the flexibility and strength to maintain correct postures.  The correct posture provides the best environment for your joints to work. All of your joints have less wear and tear when properly supported by a spine with good posture. This means you will experience a healthier, less painful body.  Correct posture must be practiced with all of your activities, especially prolonged sitting and standing. Correct posture is as important when doing repetitive low-stress activities (typing) as it is when doing a single heavy-load activity (lifting). RESTING  POSITIONS Consider which positions are most painful for you when choosing a resting position. If you have pain with flexion-based activities (sitting, bending, stooping, squatting), choose a position that allows you to rest in a less flexed posture. You would want to avoid curling into a fetal position on your side. If your pain worsens with extension-based activities (prolonged standing, working overhead), avoid resting in an extended position such as sleeping on your stomach. Most people will find more comfort when they rest with their spine in a more neutral position, neither too rounded nor too arched. Lying on a non-sagging bed on your side with a pillow between your knees, or on your back with a pillow under your knees will often provide some relief. Keep in mind, being in any one position for a prolonged period of time, no matter how correct your posture, can still lead to stiffness. PROPER SITTING POSTURE In order to minimize stress and discomfort on your spine, you must sit with correct posture. Sitting with good posture should be effortless for a healthy body. Returning to good posture is a gradual process. Many people can work toward this most comfortably by using various supports until they have the flexibility and strength to maintain this posture on their own. When sitting with proper posture, your ears will fall over your shoulders and your shoulders will fall over your hips. You should use the back of the chair to support your upper back. Your lower back will be in a neutral position, just slightly arched. You may place a small pillow or folded towel at the base of your lower back for  support.  When working at a desk, create an environment that supports good, upright posture. Without extra support, muscles tire, which leads to excessive strain on joints and other tissues. Keep these recommendations in mind: CHAIR:  A chair should be able to slide under your desk when your back makes contact  with the back of the chair. This allows you to work closely.  The chair's height should allow your eyes to be level with the upper part of your monitor and your hands to be slightly lower than your elbows. BODY POSITION  Your feet should make contact with the floor. If this is not possible,  use a foot rest.  Keep your ears over your shoulders. This will reduce stress on your neck and low back. INCORRECT SITTING POSTURES  If you are feeling tired and unable to assume a healthy sitting posture, do not slouch or slump. This puts excessive strain on your back tissues, causing more damage and pain. Healthier options include:  Using more support, like a lumbar pillow.  Switching tasks to something that requires you to be upright or walking.  Talking a brief walk.  Lying down to rest in a neutral-spine position. PROLONGED STANDING WHILE SLIGHTLY LEANING FORWARD  When completing a task that requires you to lean forward while standing in one place for a long time, place either foot up on a stationary 2-4 inch high object to help maintain the best posture. When both feet are on the ground, the lower back tends to lose its slight inward curve. If this curve flattens (or becomes too large), then the back and your other joints will experience too much stress, tire more quickly, and can cause pain. CORRECT STANDING POSTURES Proper standing posture should be assumed with all daily activities, even if they only take a few moments, like when brushing your teeth. As in sitting, your ears should fall over your shoulders and your shoulders should fall over your hips. You should keep a slight tension in your abdominal muscles to brace your spine. Your tailbone should point down to the ground, not behind your body, resulting in an over-extended swayback posture.  INCORRECT STANDING POSTURES  Common incorrect standing postures include a forward head, locked knees and/or an excessive swayback. WALKING Walk with an  upright posture. Your ears, shoulders and hips should all line-up. PROLONGED ACTIVITY IN A FLEXED POSITION When completing a task that requires you to bend forward at your waist or lean over a low surface, try to find a way to stabilize 3 out of 4 of your limbs. You can place a hand or elbow on your thigh or rest a knee on the surface you are reaching across. This will provide you more stability, so that your muscles do not tire as quickly. By keeping your knees relaxed, or slightly bent, you will also reduce stress across your lower back. CORRECT LIFTING TECHNIQUES DO :  Assume a wide stance. This will provide you more stability and the opportunity to get as close as possible to the object which you are lifting.  Tense your abdominals to brace your spine. Bend at the knees and hips. Keeping your back locked in a neutral-spine position, lift using your leg muscles. Lift with your legs, keeping your back straight.  Test the weight of unknown objects before attempting to lift them.  Try to keep your elbows locked down at your sides in order get the best strength from your shoulders when carrying an object.  Always ask for help when lifting heavy or awkward objects. INCORRECT LIFTING TECHNIQUES DO NOT:   Lock your knees when lifting, even if it is a small object.  Bend and twist. Pivot at your feet or move your feet when needing to change directions.  Assume that you can safely pick up even a paperclip without proper posture. Document Released: 11/29/2005 Document Revised: 02/21/2012 Document Reviewed: 03/13/2009 [facility] Patient Information 2015 [place], [place]. This information is not intended to replace advice given to you by your health care provider. Make sure you discuss any questions you have with your health care provider.

## 2015-03-17 NOTE — Progress Notes (Signed)
Subjective:    Patient ID: Lorraine Ayers, female    DOB: 1971-10-28, 44 y.o.   MRN: [id number]  HPI Last Thursday, approximately 4 days ago, she was bending over to put something in and 4. When she started to stand back up she felt her back locks suddenly. His been painful since then. She's been using a heating pad, ibuprofen and an over-the-counter rub. Now having bilat lower back pain over the spine. No radiation fo sxs.  She feels like she has gained weight in her waist.  She feels really bloated. She has a brown vaginal discharge as well. No constipations.  Stared walking over the break last week. She is moving.  No trauma.  Has been moving some boxes.    Review of Systems  BP 138/79 mmHg  Pulse 75  Ht 5' 2.6" (1.59 m)    No Known Allergies  No past medical history on file.  Past Surgical History  Procedure Laterality Date  . Cesarean section  5-93  . Tubal ligation  12-98  . Knee surgery  2008    acl REPAIR ON LEFT KNEE.     History   Social History  . Marital Status: Single    Spouse Name: N/A  . Number of Children: N/A  . Years of Education: N/A   Occupational History  . Not on file.   Social History Main Topics  . Smoking status: Never Smoker   . Smokeless tobacco: Never Used  . Alcohol Use: 0.6 oz/week    1 Cans of beer per week  . Drug Use: No  . Sexual Activity: Not on file   Other Topics Concern  . Not on file   Social History Narrative    Family History  Problem Relation Age of Onset  . Heart attack Father   . Hyperlipidemia Father   . Hypertension Father   . Alcohol abuse Other   . Dementia Mother     Outpatient Encounter Prescriptions as of 03/17/2015  Medication Sig  . citalopram (CELEXA) 20 MG tablet Take 1 tablet (20 mg total) by mouth daily.  . metaxalone (SKELAXIN) 800 MG tablet Take 1 tablet (800 mg total) by mouth 3 (three) times daily.          Objective:   Physical Exam  Constitutional: She is oriented to person, place,  and time. She appears well-developed and well-nourished.  HENT:  Head: Normocephalic and atraumatic.  Eyes: Conjunctivae and EOM are normal.  Cardiovascular: Normal rate.   Pulmonary/Chest: Effort normal.  Abdominal: Soft. Bowel sounds are normal. She exhibits no distension and no mass. There is tenderness. There is no rebound and no guarding.  Mildly tender in the left lower quadrant. No masses.  Musculoskeletal:  Normal lumbar flexion and extension. The she had more pain with extension. Normal rotation right and left and normal side bending right and left. Nontender over the spine or paraspinous muscles. Nontender over the SI joints. Negative straight leg raise bilaterally. Hip, knee, ankle strength is 5 out of 5 bilaterally.  Neurological: She is alert and oriented to person, place, and time.  Skin: Skin is dry. No pallor.  Psychiatric: She has a normal mood and affect. Her behavior is normal.          Assessment & Plan:  Acute back injury - most likely muscle spasm. Though her pain is mainly focused over the spine itself and she has been doing a lot of heavy lifting cement going to go ahead and  get an x-ray today. No radiation of symptoms which is reassuring. Given muscle relaxer. Encourages take ibuprofen 6 mg every 6 hours. Make sure take with food and water stop immediately if any GI upset or irritation. Handout given on some stretches to do on her own at home. Call if not improving over the next week.  Vaginal D/C - will collect wet prep sample.   Bloating 1 week-unclear etiology. She is a little bit tender on the left side of the abdomen. But does often get ovarian pain on that side when she gets close to having her cycle. She just had her period a couple weeks ago. Encouraged her to keep an eye on it over the next week. If she restarts her cycle starts to feel better than good. She certainly could be perimenopausal at age 51. If the bloating and pain persists for more than a week  and please call me back and we'll schedule her for a pelvic ultrasound.

## 2015-03-18 LAB — KOH PREP: RESULT - KOH: NONE SEEN

## 2015-03-20 ENCOUNTER — Ambulatory Visit: Payer: BC Managed Care – PPO

## 2016-02-02 ENCOUNTER — Other Ambulatory Visit: Payer: Self-pay | Admitting: Family Medicine

## 2016-02-04 ENCOUNTER — Encounter: Payer: Self-pay | Admitting: Family Medicine

## 2016-02-04 ENCOUNTER — Ambulatory Visit (INDEPENDENT_AMBULATORY_CARE_PROVIDER_SITE_OTHER): Payer: BC Managed Care – PPO | Admitting: Family Medicine

## 2016-02-04 VITALS — BP 139/86 | HR 82 | Wt 177.0 lb

## 2016-02-04 DIAGNOSIS — F430 Acute stress reaction: Secondary | ICD-10-CM | POA: Diagnosis not present

## 2016-02-04 DIAGNOSIS — R030 Elevated blood-pressure reading, without diagnosis of hypertension: Secondary | ICD-10-CM | POA: Diagnosis not present

## 2016-02-04 DIAGNOSIS — Z13220 Encounter for screening for lipoid disorders: Secondary | ICD-10-CM | POA: Diagnosis not present

## 2016-02-04 DIAGNOSIS — E669 Obesity, unspecified: Secondary | ICD-10-CM

## 2016-02-04 DIAGNOSIS — IMO0001 Reserved for inherently not codable concepts without codable children: Secondary | ICD-10-CM

## 2016-02-04 DIAGNOSIS — Z6831 Body mass index (BMI) 31.0-31.9, adult: Secondary | ICD-10-CM | POA: Diagnosis not present

## 2016-02-04 NOTE — Progress Notes (Signed)
   Subjective:    Patient ID: Lorraine Ayers, female    DOB: 12-29-1970, 45 y.o.   MRN: [id number]  HPI Acute stress - Her mother died this summer and she has not been working out and stress eating. She says recently she has really made the decision to start to get healthy again. About 2 weeks ago decided to cut out sugar and cut out soda.  She has already lost 7 lbs. She has cut back on her caffeine as well. He feels like she is doing well on her citalopram and doesn't want to adjusted her decrease her dose at this time.   Review of Systems     Objective:   Physical Exam  Constitutional: She is oriented to person, place, and time. She appears well-developed and well-nourished.  HENT:  Head: Normocephalic and atraumatic.  Neck: Neck supple. No thyromegaly present.  Cardiovascular: Normal rate, regular rhythm and normal heart sounds.   Pulmonary/Chest: Effort normal and breath sounds normal.  Lymphadenopathy:    She has no cervical adenopathy.  Neurological: She is alert and oriented to person, place, and time.  Skin: Skin is warm and dry.  Psychiatric: She has a normal mood and affect. Her behavior is normal.          Assessment & Plan:  Acute stress reaction-continue citalopram for now. She says her ultimate goal is to start weaning off the medication by December of next year. 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  Obesity/BMI 31-Blood pressure was just mildly elevated today. We discussed getting back on track with diet and exercise to get this back under control. Recommend follow-up in 3 months just to make sure that it's improving. And that she is getting her weight back down.  Due for lipid screening.

## 2016-02-13 ENCOUNTER — Other Ambulatory Visit: Payer: Self-pay | Admitting: Family Medicine

## 2016-05-03 ENCOUNTER — Ambulatory Visit: Payer: BC Managed Care – PPO | Admitting: Family Medicine

## 2016-06-18 ENCOUNTER — Other Ambulatory Visit: Payer: Self-pay | Admitting: Family Medicine

## 2016-06-18 ENCOUNTER — Ambulatory Visit (HOSPITAL_BASED_OUTPATIENT_CLINIC_OR_DEPARTMENT_OTHER)
Admission: RE | Admit: 2016-06-18 | Discharge: 2016-06-18 | Disposition: A | Discharge status: Home / Self Care | Payer: BC Managed Care – PPO | Source: Ambulatory Visit | Attending: Family Medicine | Admitting: Family Medicine

## 2016-06-18 ENCOUNTER — Encounter: Payer: Self-pay | Admitting: Family Medicine

## 2016-06-18 ENCOUNTER — Other Ambulatory Visit (HOSPITAL_COMMUNITY)
Admission: RE | Admit: 2016-06-18 | Discharge: 2016-06-18 | Disposition: A | Discharge status: Home / Self Care | Payer: BC Managed Care – PPO | Source: Ambulatory Visit | Attending: Family Medicine | Admitting: Family Medicine

## 2016-06-18 ENCOUNTER — Ambulatory Visit (INDEPENDENT_AMBULATORY_CARE_PROVIDER_SITE_OTHER): Payer: BC Managed Care – PPO | Admitting: Family Medicine

## 2016-06-18 VITALS — BP 157/80 | HR 90 | Wt 168.0 lb

## 2016-06-18 DIAGNOSIS — N920 Excessive and frequent menstruation with regular cycle: Secondary | ICD-10-CM

## 2016-06-18 DIAGNOSIS — R102 Pelvic and perineal pain: Secondary | ICD-10-CM | POA: Insufficient documentation

## 2016-06-18 DIAGNOSIS — M545 Low back pain, unspecified: Secondary | ICD-10-CM

## 2016-06-18 DIAGNOSIS — R319 Hematuria, unspecified: Secondary | ICD-10-CM

## 2016-06-18 DIAGNOSIS — Z01419 Encounter for gynecological examination (general) (routine) without abnormal findings: Secondary | ICD-10-CM | POA: Diagnosis not present

## 2016-06-18 DIAGNOSIS — D251 Intramural leiomyoma of uterus: Secondary | ICD-10-CM | POA: Insufficient documentation

## 2016-06-18 DIAGNOSIS — N923 Ovulation bleeding: Secondary | ICD-10-CM

## 2016-06-18 DIAGNOSIS — R790 Abnormal level of blood mineral: Secondary | ICD-10-CM

## 2016-06-18 DIAGNOSIS — N939 Abnormal uterine and vaginal bleeding, unspecified: Secondary | ICD-10-CM

## 2016-06-18 DIAGNOSIS — N841 Polyp of cervix uteri: Secondary | ICD-10-CM

## 2016-06-18 LAB — POCT URINALYSIS DIPSTICK
BILIRUBIN UA: NEGATIVE
GLUCOSE UA: NEGATIVE
Ketones, UA: NEGATIVE
LEUKOCYTES UA: NEGATIVE
NITRITE UA: NEGATIVE
PH UA: 6
Protein, UA: NEGATIVE
Spec Grav, UA: 1.025
UROBILINOGEN UA: 0.2

## 2016-06-18 LAB — POCT URINE PREGNANCY: PREG TEST UR: NEGATIVE

## 2016-06-18 NOTE — Progress Notes (Signed)
Subjective:    CC: Back pain   HPI: C/o of lower back pain and some lower abdominal Pain x 4 days. Worse today. Last MP was 2 weeks ago.  No dysuria. No urinary frequency. No fever.  + chills.  No heavy lifting.  Periods have been heavier the last 2 cycles.  On vacation. No vaginal sxs.  Though she is still had some spotting since her last period 2 weeks ago.  Last pap wa s 05/2014.  No nausea, vomiting, diarrhea. No change in bowels.Tubal ligation in 1998 but she is sexually active.  Past medical history, Surgical history, Family history not pertinant except as noted below, Social history, Allergies, and medications have been entered into the medical record, reviewed, and corrections made.   Review of Systems: No fevers, chills, night sweats, weight loss, chest pain, or shortness of breath.   Objective:    General: Well Developed, well nourished, and in no acute distress.  Neuro: Alert and oriented x3, extra-ocular muscles intact, sensation grossly intact.  HEENT: Normocephalic, atraumatic  Skin: Warm and dry, no rashes. Cardiac: Regular rate and rhythm, no murmurs rubs or gallops, no lower extremity edema.  Respiratory: Clear to auscultation bilaterally. Not using accessory muscles, speaking in full sentences. Abd: Normal bowel sounds. Nontender over the upper abdomen. Very tender in the right lower and left lower quadrants as well as suprapubically. No rebound or guarding.  Physical Exam  Genitourinary: Vagina normal and uterus normal. Pelvic exam was performed with patient supine. There is no rash on the right labia. There is no rash on the left labia. Cervix exhibits no motion tenderness and no friability. Right adnexum displays no mass, no tenderness and no fullness. Left adnexum displays no mass, no tenderness and no fullness.  Small polyp at the cervical os.        Impression and Recommendations:   Back Pain - likely related to her pelvic pain.   Pelvic Pain - We'll also do  urinalysis today. We'll schedule for pelvic ultrasound.I did go ahead and repeat her Pap smear today even though she's not quite due because I did see a small cervical polyp. Consider other possibilities such as diverticulitis.  UA is negative.  UPT neg  Vaginal spotting: will schedule for pelvic US.    Cervical polyp. Will need tx with OB/GYN for removal.

## 2016-06-19 LAB — CBC WITH DIFFERENTIAL/PLATELET
BASOS PCT: 0 %
Basophils Absolute: 0 cells/uL (ref 0–200)
EOS PCT: 0 %
Eosinophils Absolute: 0 cells/uL — ABNORMAL LOW (ref 15–500)
HEMATOCRIT: 30.9 % — AB (ref 35.0–45.0)
HEMOGLOBIN: 9.4 g/dL — AB (ref 11.7–15.5)
LYMPHS ABS: 1168 {cells}/uL (ref 850–3900)
Lymphocytes Relative: 16 %
MCH: 21.6 pg — ABNORMAL LOW (ref 27.0–33.0)
MCHC: 30.4 g/dL — ABNORMAL LOW (ref 32.0–36.0)
MCV: 70.9 fL — AB (ref 80.0–100.0)
MONO ABS: 365 {cells}/uL (ref 200–950)
MPV: 10.5 fL (ref 7.5–12.5)
Monocytes Relative: 5 %
NEUTROS ABS: 5767 {cells}/uL (ref 1500–7800)
NEUTROS PCT: 79 %
PLATELETS: 376 10*3/uL (ref 140–400)
RBC: 4.36 MIL/uL (ref 3.80–5.10)
RDW: 16.3 % — ABNORMAL HIGH (ref 11.0–15.0)
WBC: 7.3 10*3/uL (ref 3.8–10.8)

## 2016-06-19 LAB — COMPLETE METABOLIC PANEL WITH GFR
ALT: 10 U/L (ref 6–29)
AST: 13 U/L (ref 10–35)
Albumin: 4 g/dL (ref 3.6–5.1)
Alkaline Phosphatase: 71 U/L (ref 33–115)
BILIRUBIN TOTAL: 0.4 mg/dL (ref 0.2–1.2)
BUN: 11 mg/dL (ref 7–25)
CALCIUM: 9.1 mg/dL (ref 8.6–10.2)
CO2: 25 mmol/L (ref 20–31)
CREATININE: 0.77 mg/dL (ref 0.50–1.10)
Chloride: 103 mmol/L (ref 98–110)
GFR, Est Non African American: >89 mL/min (ref >=60)
Glucose, Bld: 106 mg/dL — ABNORMAL HIGH (ref 65–99)
Potassium: 4.1 mmol/L (ref 3.5–5.3)
Sodium: 138 mmol/L (ref 135–146)
TOTAL PROTEIN: 7 g/dL (ref 6.1–8.1)

## 2016-06-20 LAB — URINE CULTURE
Colony Count: NO GROWTH
ORGANISM ID, BACTERIA: NO GROWTH

## 2016-06-21 LAB — FERRITIN: FERRITIN: 6 ng/mL — AB (ref 10–232)

## 2016-06-21 LAB — VITAMIN B12: Vitamin B-12: 445 pg/mL (ref 200–1100)

## 2016-06-22 LAB — CYTOLOGY - PAP

## 2016-06-22 NOTE — Addendum Note (Signed)
Addended by: Teddy Spike on: 06/22/2016 05:53 PM   Modules accepted: Orders

## 2016-07-05 ENCOUNTER — Encounter: Payer: Self-pay | Admitting: Obstetrics & Gynecology

## 2016-07-05 ENCOUNTER — Ambulatory Visit (INDEPENDENT_AMBULATORY_CARE_PROVIDER_SITE_OTHER): Payer: BC Managed Care – PPO | Admitting: Obstetrics & Gynecology

## 2016-07-05 VITALS — BP 151/93 | HR 75 | Ht 62.0 in | Wt 170.0 lb

## 2016-07-05 DIAGNOSIS — N920 Excessive and frequent menstruation with regular cycle: Secondary | ICD-10-CM | POA: Insufficient documentation

## 2016-07-05 DIAGNOSIS — N841 Polyp of cervix uteri: Secondary | ICD-10-CM

## 2016-07-05 DIAGNOSIS — Z01812 Encounter for preprocedural laboratory examination: Secondary | ICD-10-CM | POA: Diagnosis not present

## 2016-07-05 DIAGNOSIS — D259 Leiomyoma of uterus, unspecified: Secondary | ICD-10-CM | POA: Diagnosis not present

## 2016-07-05 LAB — POCT URINE PREGNANCY: Preg Test, Ur: NEGATIVE

## 2016-07-05 NOTE — Progress Notes (Signed)
Subjective:     Patient ID: Lorraine Ayers, female   DOB: 10/17/1971, 45 y.o.   MRN: [id number]  HPI  45 yo female presents c/o cramping on left side for several weeks.  Pt's mother recently died from endometrial cancer.  Her mother ignored symptoms for several months and patient is worried and doesn't want to ignore symptoms.  She also has been having menorrhagia for most of her life which has worsened the past 2 years.  This past year she had to miss work the first 2 days of her menses due to heavy flow  Location: left lower quadrant several centimeters above c/s scar Quality: sharp Severity: mild to moderate Duration: minutes  Context: none Timing: intermittant Modifying factors: none Associated signs and symptoms: not associated with bowel mvmts or urination   Review of Systems  Constitutional: Negative.   HENT: Positive for voice change.   Respiratory: Negative.   Cardiovascular: Negative.   Endocrine: Negative.   Genitourinary: Positive for pelvic pain and vaginal bleeding.  Musculoskeletal:       Had back pain but not present today.  Skin: Negative.       Objective:   Physical Exam  Constitutional: She is oriented to person, place, and time. She appears well-developed and well-nourished. No distress.  HENT:  Head: Normocephalic and atraumatic.  Eyes: Conjunctivae are normal.  Pulmonary/Chest: Effort normal.  Abdominal: Soft. Bowel sounds are normal. She exhibits no distension and no mass. There is tenderness. There is no rebound and no guarding.    Genitourinary:  Genitourinary Comments: Vulva:  Tanner V  No lesion Vagina:  Pink, no lesion Urethra:   No prolapse Cervix:  Nabothian cyst, endocervical polyp Uterus:  Anteverted, mobile, NT Adnexa:  No masses, tender in LLQ but superior to ovary (see abdominal exam)  Neurological: She is alert and oriented to person, place, and time.  Skin: Skin is warm and dry.  Psychiatric: She has a normal mood and affect.    Vitals reviewed.  Uterus  Measurements: 9.7 x 4.2 x 7.2 cm. 9 x 10 x 7 mm intramural fibroid in the left anterior uterine body.  Endometrium Thickness: 5 mm.  No focal abnormality visualized.  Right ovary Measurements: 3.5 x 2.8 x 3.8 cm. Normal appearance/no adnexal mass.  Left ovary Measurements: 4.1 x 1.6 x 3.0 cm. Normal appearance/no adnexal mass.  Other findings Small volume pelvic ascites.  IMPRESSION: 10 mm intramural fibroid in the left anterior uterine body.  Endometrial complex measures 5 mm, within normal limits  Assessment:     45 yo female with menorrhagia and pelvic pain 1 cm fibroid, incidental finding ? ruptured ovarian cyst (small volume pelvic ascites)    Plan:     Endometrial biopsy Plan to be based on biopsy results Pinpoint tenderness on abdomen ? Musculoskeletal RTC 7-10 days  ENDOCERVICAL BIOPSY The indications for endocervical biopsy were reviewed.  Risks of the biopsy including bleeding and infection.  Cervical was cleaned with Betadine.  Forceps was used to twist the endocervical polyp from the base of it's stalk.  Silver nitrate was applied and hemostasis was achieved.      ENDOMETRIAL BIOPSY     The indications for endometrial biopsy were reviewed.   Risks of the biopsy including cramping, bleeding, infection, uterine perforation, inadequate specimen and need for additional procedures  were discussed. The patient states she understands and agrees to undergo procedure today. Consent was signed. Time out was performed. Urine HCG was negative. A sterile speculum was  placed in the patient's vagina and the cervix was prepped with Betadine. A single-toothed tenaculum was placed on the anterior lip of the cervix to stabilize it. The 3 mm pipelle was introduced into the endometrial cavity without difficulty to a depth of 9 cm, and a moderate amount of tissue was obtained and sent to pathology. The instruments were removed from the patient's  vagina. Minimal bleeding from the cervix was noted. The patient tolerated the procedure well. Routine post-procedure instructions were given to the patient. The patient will follow up to review the results and for further management.

## 2016-07-12 ENCOUNTER — Telehealth: Payer: Self-pay | Admitting: *Deleted

## 2016-07-12 NOTE — Telephone Encounter (Signed)
LM on voicemail of normal biopsy and polyp results.  Pt will call office if pain persist

## 2016-07-12 NOTE — Telephone Encounter (Signed)
-----   Message from Guss Bunde, MD sent at 07/09/2016  5:42 AM EDT ----- Endocervical polyp (benign) and benign endometrial biopsy.  RN to call and pt to come to office in 2-3 weeks if pain still continues.

## 2016-07-13 ENCOUNTER — Ambulatory Visit: Payer: BC Managed Care – PPO | Admitting: Obstetrics & Gynecology

## 2016-07-13 DIAGNOSIS — Z09 Encounter for follow-up examination after completed treatment for conditions other than malignant neoplasm: Secondary | ICD-10-CM

## 2017-01-05 ENCOUNTER — Ambulatory Visit (INDEPENDENT_AMBULATORY_CARE_PROVIDER_SITE_OTHER): Payer: BC Managed Care – PPO | Admitting: Physician Assistant

## 2017-01-05 ENCOUNTER — Encounter: Payer: Self-pay | Admitting: Physician Assistant

## 2017-01-05 VITALS — BP 160/94 | HR 81 | Wt 181.0 lb

## 2017-01-05 DIAGNOSIS — G43009 Migraine without aura, not intractable, without status migrainosus: Secondary | ICD-10-CM | POA: Diagnosis not present

## 2017-01-05 DIAGNOSIS — I10 Essential (primary) hypertension: Secondary | ICD-10-CM | POA: Diagnosis not present

## 2017-01-05 MED ORDER — METOCLOPRAMIDE HCL 10 MG PO TABS: ORAL_TABLET | ORAL | 0 refills | Status: DC
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
## 2017-01-05 NOTE — Patient Instructions (Addendum)
Your blood pressure was very elevated today. You should follow-up with your PCP in 1 week to discuss starting antihypertensive medication  If headache recurs, start headache diary (attached) and follow-up with PCP in 1 month Trial low Tyramine diet Tylenol 1000mg , Benadryl 25mg , and Reglan 10-20mg  at the onset of headache  Migraine Headache A migraine headache is an intense, throbbing pain on one side or both sides of the head. Migraines may also cause other symptoms, such as nausea, vomiting, and sensitivity to light and noise. What are the causes? Doing or taking certain things may also trigger migraines, such as:  Alcohol.  Smoking.  Medicines, such as:  Medicine used to treat chest pain (nitroglycerine).  Birth control pills.  Estrogen pills.  Certain blood pressure medicines.  Aged cheeses, chocolate, or caffeine.  Foods or drinks that contain nitrates, glutamate, aspartame, or tyramine.  Physical activity. Other things that may trigger a migraine include:  Menstruation.  Pregnancy.  Hunger.  Stress, lack of sleep, too much sleep, or fatigue.  Weather changes. What increases the risk? The following factors may make you more likely to experience migraine headaches:  Age. Risk increases with age.  Family history of migraine headaches.  Being Caucasian.  Depression and anxiety.  Obesity.  Being a woman.  Having a hole in the heart (patent foramen ovale) or other heart problems. What are the signs or symptoms? The main symptom of this condition is pulsating or throbbing pain. Pain may:  Happen in any area of the head, such as on one side or both sides.  Interfere with daily activities.  Get worse with physical activity.  Get worse with exposure to bright lights or loud noises. Other symptoms may include:  Nausea.  Vomiting.  Dizziness.  General sensitivity to bright lights, loud noises, or smells. Before you get a migraine, you may get  warning signs that a migraine is developing (aura). An aura may include:  Seeing flashing lights or having blind spots.  Seeing bright spots, halos, or zigzag lines.  Having tunnel vision or blurred vision.  Having numbness or a tingling feeling.  Having trouble talking.  Having muscle weakness. How is this diagnosed? A migraine headache can be diagnosed based on:  Your symptoms.  A physical exam.  Tests, such as CT scan or MRI of the head. These imaging tests can help rule out other causes of headaches.  Taking fluid from the spine (lumbar puncture) and analyzing it (cerebrospinal fluid analysis, or CSF analysis). How is this treated? A migraine headache is usually treated with medicines that:  Relieve pain.  Relieve nausea.  Prevent migraines from coming back. Treatment may also include:  Acupuncture.  Lifestyle changes like avoiding foods that trigger migraines. Follow these instructions at home: Medicines  Take over-the-counter and prescription medicines only as told by your health care provider.  Do not drive or use heavy machinery while taking prescription pain medicine.  To prevent or treat constipation while you are taking prescription pain medicine, your health care provider may recommend that you:  Drink enough fluid to keep your urine clear or pale yellow.  Take over-the-counter or prescription medicines.  Eat foods that are high in fiber, such as fresh fruits and vegetables, whole grains, and beans.  Limit foods that are high in fat and processed sugars, such as fried and sweet foods. Lifestyle  Avoid alcohol use.  Do not use any products that contain nicotine or tobacco, such as cigarettes and e-cigarettes. If you need help quitting,  ask your health care provider.  Get at least 8 hours of sleep every night.  Limit your stress. General instructions  Keep a journal to find out what may trigger your migraine headaches. For example, write  down:  What you eat and drink.  How much sleep you get.  Any change to your diet or medicines.  If you have a migraine:  Avoid things that make your symptoms worse, such as bright lights.  It may help to lie down in a dark, quiet room.  Do not drive or use heavy machinery.  Ask your health care provider what activities are safe for you while you are experiencing symptoms.  Keep all follow-up visits as told by your health care provider. This is important. Contact a health care provider if:  You develop symptoms that are different or more severe than your usual migraine symptoms. Get help right away if:  Your migraine becomes severe.  You have a fever.  You have a stiff neck.  You have vision loss.  Your muscles feel weak or like you cannot control them.  You start to lose your balance often.  You develop trouble walking.  You faint. This information is not intended to replace advice given to you by your health care provider. Make sure you discuss any questions you have with your health care provider. Document Released: 11/29/2005 Document Revised: 06/18/2016 Document Reviewed: 05/17/2016 Elsevier Interactive Patient Education  2017 [place] DASH stands for "Dietary Approaches to Stop Hypertension." The DASH eating plan is a healthy eating plan that has been shown to reduce high blood pressure (hypertension). Additional health benefits may include reducing the risk of type 2 diabetes mellitus, heart disease, and stroke. The DASH eating plan may also help with weight loss. What do I need to know about the DASH eating plan? For the DASH eating plan, you will follow these general guidelines:  Choose foods with less than 150 milligrams of sodium per serving (as listed on the food label).  Use salt-free seasonings or herbs instead of table salt or sea salt.  Check with your health care provider or pharmacist before using salt substitutes.  Eat  lower-sodium products. These are often labeled as "low-sodium" or "no salt added."  Eat fresh foods. Avoid eating a lot of canned foods.  Eat more vegetables, fruits, and low-fat dairy products.  Choose whole grains. Look for the word "whole" as the first word in the ingredient list.  Choose fish and skinless chicken or [place] more often than red meat. Limit fish, poultry, and meat to 6 oz (170 g) each day.  Limit sweets, desserts, sugars, and sugary drinks.  Choose heart-healthy fats.  Eat more home-cooked food and less restaurant, buffet, and fast food.  Limit fried foods.  Do not fry foods. Cook foods using methods such as baking, boiling, grilling, and broiling instead.  When eating at a restaurant, ask that your food be prepared with less salt, or no salt if possible. What foods can I eat? Seek help from a dietitian for individual calorie needs. Grains  Whole grain or whole wheat bread. Brown rice. Whole grain or whole wheat pasta. Quinoa, bulgur, and whole grain cereals. Low-sodium cereals. Corn or whole wheat flour tortillas. Whole grain cornbread. Whole grain crackers. Low-sodium crackers. Vegetables  Fresh or frozen vegetables (raw, steamed, roasted, or grilled). Low-sodium or reduced-sodium tomato and vegetable juices. Low-sodium or reduced-sodium tomato sauce and paste. Low-sodium or reduced-sodium canned vegetables. Fruits  All  fresh, canned (in natural juice), or frozen fruits. Meat and Other Protein Products  Ground beef (85% or leaner), grass-fed beef, or beef trimmed of fat. Skinless chicken or [place]. Ground chicken or [place]. Pork trimmed of fat. All fish and seafood. Eggs. Dried beans, peas, or lentils. Unsalted nuts and seeds. Unsalted canned beans. Dairy  Low-fat dairy products, such as skim or 1% milk, 2% or reduced-fat cheeses, low-fat ricotta or cottage cheese, or plain low-fat yogurt. Low-sodium or reduced-sodium cheeses. Fats and Oils  Tub margarines  without trans fats. Light or reduced-fat mayonnaise and salad dressings (reduced sodium). Avocado. Safflower, olive, or canola oils. Natural peanut or almond butter. Other  Unsalted popcorn and pretzels. The items listed above may not be a complete list of recommended foods or beverages. Contact your dietitian for more options.  What foods are not recommended? Grains  White bread. White pasta. White rice. Refined cornbread. Bagels and croissants. Crackers that contain trans fat. Vegetables  Creamed or fried vegetables. Vegetables in a cheese sauce. Regular canned vegetables. Regular canned tomato sauce and paste. Regular tomato and vegetable juices. Fruits  Canned fruit in light or heavy syrup. Fruit juice. Meat and Other Protein Products  Fatty cuts of meat. Ribs, chicken wings, bacon, sausage, bologna, salami, chitterlings, fatback, hot dogs, bratwurst, and packaged luncheon meats. Salted nuts and seeds. Canned beans with salt. Dairy  Whole or 2% milk, cream, half-and-half, and cream cheese. Whole-fat or sweetened yogurt. Full-fat cheeses or blue cheese. Nondairy creamers and whipped toppings. Processed cheese, cheese spreads, or cheese curds. Condiments  Onion and garlic salt, seasoned salt, table salt, and sea salt. Canned and packaged gravies. Worcestershire sauce. Tartar sauce. Barbecue sauce. Teriyaki sauce. Soy sauce, including reduced sodium. Steak sauce. Fish sauce. Oyster sauce. Cocktail sauce. Horseradish. Ketchup and mustard. Meat flavorings and tenderizers. Bouillon cubes. Hot sauce. Tabasco sauce. Marinades. Taco seasonings. Relishes. Fats and Oils  Butter, stick margarine, lard, shortening, ghee, and bacon fat. Coconut, palm kernel, or palm oils. Regular salad dressings. Other  Pickles and olives. Salted popcorn and pretzels. The items listed above may not be a complete list of foods and beverages to avoid. Contact your dietitian for more information.  Where can I find more  information? National Heart, Lung, and Blood Institute: travelstabloid.com This information is not intended to replace advice given to you by your health care provider. Make sure you discuss any questions you have with your health care provider. Document Released: 11/18/2011 Document Revised: 05/06/2016 Document Reviewed: 10/03/2013 Elsevier Interactive Patient Education  2017 [place].  Physical Activity Recommendations for modifying lipids and lowering blood pressure Engage in aerobic physical activity to reduce LDL-cholesterol, non-HDL-cholesterol, and blood pressure  Frequency: 3-4 sessions per week  Intensity: moderate to vigorous  Duration: 40 minutes on average  Physical Activity Recommendations for secondary prevention 1. Aerobic exercise  Frequency: 3-5 sessions per week  Intensity: 50-80% capacity  Duration: 20 - 60 minutes  Examples: walking, treadmill, cycling, rowing, stair climbing, and arm/leg ergometry  2. Resistance exercise  Frequency: 2-3 sessions per week  Intensity: 10-15 repetitions/set to moderate fatigue  Duration: 1-3 sets of 8-10 upper and lower body exercises  Examples: calisthenics, elastic bands, cuff/hand weights, dumbbels, free weights, wall pulleys, and weight machines  Heart-Healthy Lifestyle  Eating a diet rich in vegetables, fruits and whole grains: also includes low-fat dairy products, poultry, fish, legumes, and nuts; limit intake of sweets, sugar-sweetened beverages and red meats  Getting regular exercise  Maintaining a healthy weight  Not  smoking or getting help quitting  Staying on top of your health; for some people, lifestyle changes alone may not be enough to prevent a heart attack or stroke. In these cases, taking a statin at the right dose will most likely be necessary

## 2017-01-05 NOTE — Progress Notes (Signed)
Head

## 2017-01-20 ENCOUNTER — Ambulatory Visit: Payer: BC Managed Care – PPO | Admitting: Family Medicine

## 2017-02-03 ENCOUNTER — Other Ambulatory Visit: Payer: Self-pay | Admitting: Family Medicine

## 2017-02-18 ENCOUNTER — Other Ambulatory Visit: Payer: Self-pay | Admitting: Family Medicine

## 2017-02-21 ENCOUNTER — Telehealth: Payer: Self-pay | Admitting: Family Medicine

## 2017-02-21 NOTE — Telephone Encounter (Signed)
I called patient and left a message stating that she is due for a f/u appt with Dr. Madilyn Fireman on her meds

## 2017-02-22 ENCOUNTER — Other Ambulatory Visit: Payer: Self-pay | Admitting: Family Medicine

## 2017-06-28 ENCOUNTER — Ambulatory Visit (INDEPENDENT_AMBULATORY_CARE_PROVIDER_SITE_OTHER): Payer: BC Managed Care – PPO | Admitting: Family Medicine

## 2017-06-28 ENCOUNTER — Ambulatory Visit: Payer: BC Managed Care – PPO | Admitting: Family Medicine

## 2017-06-28 ENCOUNTER — Other Ambulatory Visit: Payer: Self-pay | Admitting: Family Medicine

## 2017-06-28 ENCOUNTER — Encounter: Payer: Self-pay | Admitting: Family Medicine

## 2017-06-28 VITALS — BP 152/94 | HR 85 | Ht 62.0 in | Wt 182.0 lb

## 2017-06-28 DIAGNOSIS — R635 Abnormal weight gain: Secondary | ICD-10-CM | POA: Diagnosis not present

## 2017-06-28 DIAGNOSIS — I10 Essential (primary) hypertension: Secondary | ICD-10-CM | POA: Diagnosis not present

## 2017-06-28 DIAGNOSIS — Z6833 Body mass index (BMI) 33.0-33.9, adult: Secondary | ICD-10-CM

## 2017-06-28 DIAGNOSIS — Z0189 Encounter for other specified special examinations: Secondary | ICD-10-CM

## 2017-06-28 DIAGNOSIS — G43009 Migraine without aura, not intractable, without status migrainosus: Secondary | ICD-10-CM

## 2017-06-28 MED ORDER — HYDROCHLOROTHIAZIDE 25 MG PO TABS: 25.0000 mg | ORAL_TABLET | Freq: Every day | ORAL | 0 refills | Status: DC

## 2017-06-28 NOTE — Progress Notes (Signed)
Subjective:    CC: HTN  HPI:  Hypertension- She denies any chest pain or shortness of breath. She's family at the point that she is willing to consider a medication. She really feels like it's related to some of her weight gain but hasn't been able to get the weight off.  She is also frustrated bc She has been unable to lose weight.  She says over the last several months she actually cut back on eating out and fast food. She is trying to watch portion control though she admits some days are so busy that she doesn't really eat and she overeats in the evenings. She has been working out for about 40 minutes daily and hasn't been able to lose any weight. She did come off of her citalopram about 6 months ago and says she's actually been doing really well without it.   Past medical history, Surgical history, Family history not pertinant except as noted below, Social history, Allergies, and medications have been entered into the medical record, reviewed, and corrections made.   Review of Systems: No fevers, chills, night sweats, weight loss, chest pain, or shortness of breath.   Objective:    General: Well Developed, well nourished, and in no acute distress.  Neuro: Alert and oriented x3, extra-ocular muscles intact, sensation grossly intact.  HEENT: Normocephalic, atraumatic  Skin: Warm and dry, no rashes. Cardiac: Regular rate and rhythm, no murmurs rubs or gallops, no lower extremity edema.  Respiratory: Clear to auscultation bilaterally. Not using accessory muscles, speaking in full sentences.   Impression and Recommendations:    HTN - new dx. Will start HCTZ. Follow-up in 4 weeks to make sure that she is tolerating the medication well and to do a BMP and to adjust the regimen if needed. Continue work on [employer] and regular exercise and weight loss.  Abnormal weight gain/ BMI 33 - will check hormone levels per her request. She is about the right age for perimenopausal symptoms. PHQ 2  was essentially negative today. She did mark positive for difficulty with sleep she says happens maybe a couple times a week as well as sometimes feeling like she has low energy but she says it's not persistent. We'll also check her thyroid. Discussed setting calorie goals using a smart phone application call my fitness pal. Since she often skips breakfast we discussed using a protein shake such as from your protein first thing in the morning, packing her lunch and eating at least 2 snacks during the day. I would like for her to try this consistently setting calorie goals for 30 days to see if she is able to actually lose weight. If she is not at that point can consider bariatric referral. We discussed how comprehensive program to be very helpful. Medication could also be discussed at that time as well.  Time spent 30 minutes, greater than 50% time discussing blood pressure and weight gain.

## 2017-06-28 NOTE — Patient Instructions (Signed)
Recommend start my fitness pal. This is a smart phone application and we downloaded for free. Recommend using premiere protein drink as a breakfast substitute. Try to pack a lunch as well as 2 snacks, one in the morning and 1 in the late afternoon.

## 2017-06-29 ENCOUNTER — Telehealth: Payer: Self-pay

## 2017-06-29 DIAGNOSIS — E611 Iron deficiency: Secondary | ICD-10-CM

## 2017-06-29 LAB — CBC WITH DIFFERENTIAL/PLATELET
BASOS PCT: 0 %
Basophils Absolute: 0 cells/uL (ref 0–200)
EOS PCT: 1 %
Eosinophils Absolute: 71 cells/uL (ref 15–500)
HCT: 31.2 % — ABNORMAL LOW (ref 35.0–45.0)
HEMOGLOBIN: 9.5 g/dL — AB (ref 11.7–15.5)
LYMPHS ABS: 1704 {cells}/uL (ref 850–3900)
Lymphocytes Relative: 24 %
MCH: 21.3 pg — ABNORMAL LOW (ref 27.0–33.0)
MCHC: 30.4 g/dL — ABNORMAL LOW (ref 32.0–36.0)
MCV: 69.8 fL — ABNORMAL LOW (ref 80.0–100.0)
MONO ABS: 426 {cells}/uL (ref 200–950)
MPV: 10.7 fL (ref 7.5–12.5)
Monocytes Relative: 6 %
NEUTROS ABS: 4899 {cells}/uL (ref 1500–7800)
Neutrophils Relative %: 69 %
Platelets: 306 10*3/uL (ref 140–400)
RBC: 4.47 MIL/uL (ref 3.80–5.10)
RDW: 16 % — ABNORMAL HIGH (ref 11.0–15.0)
WBC: 7.1 10*3/uL (ref 3.8–10.8)

## 2017-06-29 LAB — LIPID PANEL W/REFLEX DIRECT LDL
Cholesterol: 208 mg/dL — ABNORMAL HIGH (ref <200)
HDL: 65 mg/dL (ref >50)
LDL-CHOLESTEROL: 118 mg/dL — AB
NON-HDL CHOLESTEROL (CALC): 143 mg/dL — AB (ref <130)
TRIGLYCERIDES: 141 mg/dL (ref <150)
Total CHOL/HDL Ratio: 3.2 Ratio (ref <5.0)

## 2017-06-29 LAB — LUTEINIZING HORMONE: LH: 2.1 m[IU]/mL

## 2017-06-29 LAB — TSH: TSH: 1.6 mIU/L

## 2017-06-29 LAB — ESTRADIOL: ESTRADIOL: 119 pg/mL

## 2017-06-29 LAB — PROGESTERONE: Progesterone: 11.62 ng/mL

## 2017-06-29 LAB — FOLLICLE STIMULATING HORMONE: FSH: 2.3 m[IU]/mL

## 2017-06-29 NOTE — Telephone Encounter (Signed)
Per PCP added Iron and Ferratin labs to labs that was gotten on 06/28/2017. Lendy Dittrich,CMA

## 2017-06-30 LAB — FERRITIN: Ferritin: 5 ng/mL — ABNORMAL LOW (ref 10–232)

## 2017-06-30 LAB — IRON: Iron: 20 ug/dL — ABNORMAL LOW (ref 40–190)

## 2017-07-06 ENCOUNTER — Encounter: Payer: Self-pay | Admitting: Family Medicine

## 2017-07-27 ENCOUNTER — Ambulatory Visit: Payer: BC Managed Care – PPO | Admitting: Family Medicine

## 2017-07-27 ENCOUNTER — Telehealth: Payer: Self-pay | Admitting: Family Medicine

## 2017-07-27 NOTE — Telephone Encounter (Signed)
Please call her. I am not sure what she needs/means.  She saw GYN last year so she has a gyn.  I talked to her about weight gain when I saw her and BP.  If she is wanting to discuss hormone therapy then I can do that as well. . If I am more clear on the issue, then I am not opposed to her switching at all.

## 2017-07-27 NOTE — Telephone Encounter (Signed)
Patient called request to change providers from Dr. Madilyn Fireman to Dr. Sheppard Coil feels she would be suited a little better with Dr. Sheppard Coil regarding her specialty in the female issues shes been having. Please Adv

## 2017-07-28 NOTE — Telephone Encounter (Signed)
Left VM for Pt to return clinic call, callback information provided. 

## 2017-08-02 NOTE — Telephone Encounter (Signed)
I certainly have an interest in women's health and I do certain procedured such as IUD that other providers here don't do, but I believe the patient has an incorrect understanding of that role in that I have no special certifications in woman's health nor am I an OB/GYN. Depending on her issues, it sounds like this may be easily taken care of by her PCP or her OB/GYN.

## 2017-10-18 ENCOUNTER — Other Ambulatory Visit: Payer: Self-pay | Admitting: Family Medicine

## 2017-10-18 DIAGNOSIS — R635 Abnormal weight gain: Secondary | ICD-10-CM

## 2017-10-18 DIAGNOSIS — I10 Essential (primary) hypertension: Secondary | ICD-10-CM

## 2018-01-19 ENCOUNTER — Other Ambulatory Visit: Payer: Self-pay | Admitting: Family Medicine

## 2018-01-19 DIAGNOSIS — I10 Essential (primary) hypertension: Secondary | ICD-10-CM

## 2018-01-19 DIAGNOSIS — R635 Abnormal weight gain: Secondary | ICD-10-CM

## 2018-02-26 ENCOUNTER — Other Ambulatory Visit: Payer: Self-pay | Admitting: Family Medicine

## 2018-02-26 DIAGNOSIS — R635 Abnormal weight gain: Secondary | ICD-10-CM

## 2018-02-26 DIAGNOSIS — I10 Essential (primary) hypertension: Secondary | ICD-10-CM

## 2018-03-31 ENCOUNTER — Other Ambulatory Visit: Payer: Self-pay | Admitting: Family Medicine

## 2018-03-31 DIAGNOSIS — I10 Essential (primary) hypertension: Secondary | ICD-10-CM

## 2018-03-31 DIAGNOSIS — R635 Abnormal weight gain: Secondary | ICD-10-CM

## 2018-04-15 ENCOUNTER — Other Ambulatory Visit: Payer: Self-pay | Admitting: Family Medicine

## 2018-04-15 DIAGNOSIS — I10 Essential (primary) hypertension: Secondary | ICD-10-CM

## 2018-04-15 DIAGNOSIS — R635 Abnormal weight gain: Secondary | ICD-10-CM

## 2018-05-01 ENCOUNTER — Ambulatory Visit (INDEPENDENT_AMBULATORY_CARE_PROVIDER_SITE_OTHER): Payer: BC Managed Care – PPO | Admitting: Family Medicine

## 2018-05-01 ENCOUNTER — Encounter: Payer: Self-pay | Admitting: Family Medicine

## 2018-05-01 VITALS — BP 130/74 | HR 84 | Ht 62.0 in | Wt 179.0 lb

## 2018-05-01 DIAGNOSIS — I10 Essential (primary) hypertension: Secondary | ICD-10-CM

## 2018-05-01 DIAGNOSIS — N912 Amenorrhea, unspecified: Secondary | ICD-10-CM | POA: Diagnosis not present

## 2018-05-01 DIAGNOSIS — E611 Iron deficiency: Secondary | ICD-10-CM

## 2018-05-01 DIAGNOSIS — R635 Abnormal weight gain: Secondary | ICD-10-CM | POA: Diagnosis not present

## 2018-05-01 MED ORDER — HYDROCHLOROTHIAZIDE 25 MG PO TABS: ORAL_TABLET | ORAL | 1 refills | Status: DC

## 2018-05-01 NOTE — Progress Notes (Signed)
Subjective:    Patient ID: Lorraine Ayers, female    DOB: [birth year], 47 y.o.   MRN: [id number]  HPI Hypertension-here to follow-up for hypertension.  When I last saw her last summer we had recently started her on HCTZ because of elevated blood pressures.  She is been tolerating the medication without any side effects or problems.  She has not had a repeat BMP since starting the medication.  Pt denies chest pain, SOB, dizziness, or heart palpitations.  Taking meds as directed w/o problems.  Denies medication side effects.    Abnormal weight gain- she is down 3 lbs since I saw her last summer.  She is doing really well overall and has made some major dietary changes.  She is eating mostly low-carb.  She is cut back on fast foods.  Her headaches have been significantly better.  In fact after I last saw her she had about 1 more of a severe migraine and then got better.  Also been trying to walk regularly.  Though recently she is gotten off track a little bit but plans on getting back on track.  Iron deficiency-she has changed her diet and increased her intake of foods rich in iron but is not currently taking a supplement.  Did want to let me know that she has not had her period in almost 2 months.  Normally she has very heavy periods usually last 5 days..   Review of Systems  BP (!) 141/78   Pulse 84   Ht 5\' 2"  (1.575 m)   Wt 179 lb (81.2 kg)   SpO2 98%   BMI 32.74 kg/m     No Known Allergies  No past medical history on file.  Past Surgical History:  Procedure Laterality Date  . CESAREAN SECTION  5-93  . KNEE SURGERY  2008   acl REPAIR ON LEFT KNEE.   . TUBAL LIGATION  12-98    Social History   Socioeconomic History  . Marital status: Single    Spouse name: Not on file  . Number of children: Not on file  . Years of education: Not on file  . Highest education level: Not on file  Occupational History  . Not on file  Social Needs  . Financial resource strain: Not on file   . Food insecurity:    Worry: Not on file    Inability: Not on file  . Transportation needs:    Medical: Not on file    Non-medical: Not on file  Tobacco Use  . Smoking status: Never Smoker  . Smokeless tobacco: Never Used  Substance and Sexual Activity  . Alcohol use: Yes    Alcohol/week: 0.6 oz    Types: 1 Cans of beer per week  . Drug use: No  . Sexual activity: Yes  Lifestyle  . Physical activity:    Days per week: Not on file    Minutes per session: Not on file  . Stress: Not on file  Relationships  . Social connections:    Talks on phone: Not on file    Gets together: Not on file    Attends religious service: Not on file    Active member of club or organization: Not on file    Attends meetings of clubs or organizations: Not on file    Relationship status: Not on file  . Intimate partner violence:    Fear of current or ex partner: Not on file    Emotionally abused: Not on  file    Physically abused: Not on file    Forced sexual activity: Not on file  Other Topics Concern  . Not on file  Social History Narrative  . Not on file    Family History  Problem Relation Age of Onset  . Heart attack Father   . Hyperlipidemia Father   . Hypertension Father   . Alcohol abuse Other   . Dementia Mother   . Uterine cancer Mother     Outpatient Encounter Medications as of 05/01/2018  Medication Sig  . hydrochlorothiazide (HYDRODIURIL) 25 MG tablet TAKE 1 TABLET (25 MG TOTAL) BY MOUTH DAILY.  . [DISCONTINUED] hydrochlorothiazide (HYDRODIURIL) 25 MG tablet TAKE 1 TABLET (25 MG TOTAL) BY MOUTH DAILY. LAST REFILL.CALL OFFICE TO SCHEDULE AN APPOINTMENT.  . [DISCONTINUED] hydrochlorothiazide (HYDRODIURIL) 25 MG tablet TAKE 1 TABLET (25 MG TOTAL) BY MOUTH DAILY. LAST REFILL.CALL OFFICE TO SCHEDULE AN APPOINTMENT.   No facility-administered encounter medications on file as of 05/01/2018.          Objective:   Physical Exam  Constitutional: She is oriented to person, place,  and time. She appears well-developed and well-nourished.  HENT:  Head: Normocephalic and atraumatic.  Cardiovascular: Normal rate, regular rhythm and normal heart sounds.  Pulmonary/Chest: Effort normal and breath sounds normal.  Neurological: She is alert and oriented to person, place, and time.  Skin: Skin is warm and dry.  Psychiatric: She has a normal mood and affect. Her behavior is normal.          Assessment & Plan:  Hypertension-due for repeat BMP today just to make sure potassium and electrolytes are normal.  Well controlled.  Continue current regimen and follow-up in 6 months.  Amenorrhea-we will check estradiol, FSH and LH.  Iron deficiency anemia-she has changed her diet but she is not currently on an iron supplement.  Due to recheck levels.  Abnormal weight gain-she is down 3 pounds which is great.  She is gotten off track with her exercise recently.  Just continue to work on healthy diet and regular exercise.

## 2018-05-02 LAB — LUTEINIZING HORMONE: LH: 3 m[IU]/mL

## 2018-05-02 LAB — BASIC METABOLIC PANEL WITH GFR
BUN: 15 mg/dL (ref 7–25)
CO2: 28 mmol/L (ref 20–32)
Calcium: 9.3 mg/dL (ref 8.6–10.2)
Chloride: 107 mmol/L (ref 98–110)
Creat: 0.97 mg/dL (ref 0.50–1.10)
GFR, EST AFRICAN AMERICAN: 80 mL/min/{1.73_m2} (ref >=60)
GFR, Est Non African American: 69 mL/min/{1.73_m2} (ref >=60)
GLUCOSE: 95 mg/dL (ref 65–99)
POTASSIUM: 4.4 mmol/L (ref 3.5–5.3)
SODIUM: 140 mmol/L (ref 135–146)

## 2018-05-02 LAB — IRON,TIBC AND FERRITIN PANEL
%SAT: 3 % — AB (ref 11–50)
FERRITIN: 5 ng/mL — AB (ref 10–232)
IRON: 15 ug/dL — AB (ref 40–190)
TIBC: 435 mcg/dL (calc) (ref 250–450)

## 2018-05-02 LAB — ESTRADIOL: ESTRADIOL: 60 pg/mL

## 2018-05-02 LAB — FOLLICLE STIMULATING HORMONE: FSH: 4.8 m[IU]/mL

## 2018-10-04 ENCOUNTER — Other Ambulatory Visit: Payer: Self-pay | Admitting: Family Medicine

## 2018-10-04 DIAGNOSIS — Z1231 Encounter for screening mammogram for malignant neoplasm of breast: Secondary | ICD-10-CM

## 2018-10-18 ENCOUNTER — Ambulatory Visit: Payer: BC Managed Care – PPO

## 2018-10-20 ENCOUNTER — Ambulatory Visit (INDEPENDENT_AMBULATORY_CARE_PROVIDER_SITE_OTHER): Payer: BC Managed Care – PPO

## 2018-10-20 DIAGNOSIS — Z1231 Encounter for screening mammogram for malignant neoplasm of breast: Secondary | ICD-10-CM | POA: Diagnosis not present

## 2018-11-01 ENCOUNTER — Ambulatory Visit: Payer: BC Managed Care – PPO | Admitting: Family Medicine

## 2018-11-18 ENCOUNTER — Other Ambulatory Visit: Payer: Self-pay | Admitting: Family Medicine

## 2018-11-18 DIAGNOSIS — I10 Essential (primary) hypertension: Secondary | ICD-10-CM

## 2018-11-18 DIAGNOSIS — R635 Abnormal weight gain: Secondary | ICD-10-CM

## 2018-11-20 ENCOUNTER — Ambulatory Visit: Payer: BC Managed Care – PPO | Admitting: Osteopathic Medicine

## 2018-11-20 ENCOUNTER — Encounter: Payer: Self-pay | Admitting: Osteopathic Medicine

## 2018-11-20 VITALS — BP 149/97 | HR 80 | Temp 98.0°F | Ht 62.0 in | Wt 178.0 lb

## 2018-11-20 DIAGNOSIS — J208 Acute bronchitis due to other specified organisms: Secondary | ICD-10-CM | POA: Diagnosis not present

## 2018-11-20 MED ORDER — GUAIFENESIN-CODEINE 100-10 MG/5ML PO SYRP: 5.0000 mL | ORAL_SOLUTION | Freq: Four times a day (QID) | ORAL | 0 refills | Status: DC | PRN

## 2018-11-20 NOTE — Progress Notes (Signed)
HPI: Lorraine Ayers is a 47 y.o. female who  has no past medical history on file.  she presents to [facility] today, 11/20/18,  for chief complaint of: Acute illness - Respiratory   Chest cold, onset 5 days ago, cough w/ occasional productive mucus, no OTC meds, has used honey, steam, w/ some relief. No fever/chills, no N/V/D.       Past medical, surgical, social and family history reviewed and updated as necessary.   Current medication list and allergy/intolerance information reviewed:    Current Outpatient Medications  Medication Sig Dispense Refill  . hydrochlorothiazide (HYDRODIURIL) 25 MG tablet TAKE 1 TABLET BY MOUTH EVERY DAY 90 tablet 1   No current facility-administered medications for this visit.     No Known Allergies    Review of Systems:  Constitutional:  No  fever, no chills, +recent illness, No unintentional weight changes. +significant fatigue.   HEENT: No  headache, no vision change, no hearing change, No sore throat, No  sinus pressure  Cardiac: No  chest pain, No  pressure, No palpitations  Respiratory:  No  shortness of breath. +Cough  Gastrointestinal: No  abdominal pain, No  nausea, No  vomiting,  No  blood in stool, No  diarrhea  Musculoskeletal: No new myalgia/arthralgia  Skin: No  Rash  Neurologic: No  weakness, No  dizziness  Exam:  BP (!) 149/97   Pulse 80   Temp 98 F (36.7 C) (Oral)   Ht 5\' 2"  (1.575 m)   Wt 178 lb (80.7 kg)   SpO2 98%   BMI 32.56 kg/m   Constitutional: VS see above. General Appearance: alert, well-developed, well-nourished, NAD  Eyes: Normal lids and conjunctive, non-icteric sclera  Ears, Nose, Mouth, Throat: MMM, Normal external inspection ears/nares/mouth/lips/gums. TM normal bilaterally. Pharynx/tonsils no erythema, no exudate. Nasal mucosa normal.   Neck: No masses, trachea midline. No tenderness/mass appreciated. No lymphadenopathy  Respiratory: Normal  respiratory effort. no wheeze, no rhonchi, no rales  Cardiovascular: S1/S2 normal, no murmur, no rub/gallop auscultated. RRR. No lower extremity edema.   Gastrointestinal: Nontender, no masses. Bowel sounds normal.  Musculoskeletal: Gait normal.   Neurological: Normal balance/coordination. No tremor.   Skin: warm, dry, intact.   Psychiatric: Normal judgment/insight. Normal mood and affect.     ASSESSMENT/PLAN: The encounter diagnosis was Viral bronchitis.   Declines steroid burst or inhalers If fever, worsening - RTC / ER   Meds ordered this encounter  Medications  . guaiFENesin-codeine (ROBITUSSIN AC) 100-10 MG/5ML syrup    Sig: Take 5-10 mLs by mouth 4 (four) times daily as needed for cough.    Dispense:  118 mL    Refill:  0      Patient Instructions  Medications & Home Remedies for Upper Respiratory Illness   Aches/Pains, Fever, Headache OTC Acetaminophen (Tylenol) 500 mg tablets - take max 2 tablets (1000 mg) every 6 hours (4 times per day)  OTC Ibuprofen (Motrin) 200 mg tablets - take max 4 tablets (800 mg) every 6 hours*   Sinus Congestion OTC Nasal Saline if desired to rinse OTC Oxymetolazone (Afrin, others) sparing use due to rebound congestion, NEVER use in kids OTC Phenylephrine (Sudafed) 10 mg tablets every 4 hours (or the 12-hour formulation)* OTC Diphenhydramine (Benadryl) 25 mg tablets - take max 2 tablets every 4 hours   Cough & Sore Throat Prescription cough pills or syrups as directed OTC Dextromethorphan (Robitussin, others) - cough suppressant OTC Guaifenesin (Robitussin, Mucinex, others) - expectorant (helps  cough up mucus) OTC Lozenges w/ Benzocaine + Menthol (Cepacol) Honey - as much as you want! Teas which "coat the throat" - look for ingredients Elm Bark, Licorice Root, Marshmallow Root   Other Prescription Oral Steroids to decrease inflammation and improve energy Prescription Antibiotics if these are necessary for bacterial infection -  take ALL, even if you're feeling better  OTC Zinc Lozenges within 24 hours of symptoms onset - mixed evidence this shortens the duration of the common cold Don't waste your money on Vitamin C or Echinacea in acute illness - it's already too late!            Acute Bronchitis, Adult Acute bronchitis is sudden (acute) swelling of the air tubes (bronchi) in the lungs. Acute bronchitis causes these tubes to fill with mucus, which can make it hard to breathe. It can also cause coughing or wheezing. In adults, acute bronchitis usually goes away within 2 weeks. A cough caused by bronchitis may last up to 3 weeks. Smoking, allergies, and asthma can make the condition worse. Repeated episodes of bronchitis may cause further lung problems, such as chronic obstructive pulmonary disease (COPD). What are the causes? This condition can be caused by germs and by substances that irritate the lungs, including:  Cold and flu viruses. This condition is most often caused by the same virus that causes a cold.  Bacteria.  Exposure to tobacco smoke, dust, fumes, and air pollution.  What increases the risk? This condition is more likely to develop in people who:  Have close contact with someone with acute bronchitis.  Are exposed to lung irritants, such as tobacco smoke, dust, fumes, and vapors.  Have a weak immune system.  Have a respiratory condition such as asthma.  What are the signs or symptoms? Symptoms of this condition include:  A cough.  Coughing up clear, yellow, or green mucus.  Wheezing.  Chest congestion.  Shortness of breath.  A fever.  Body aches.  Chills.  A sore throat.  How is this diagnosed? This condition is usually diagnosed with a physical exam. During the exam, your health care provider may order tests, such as chest X-rays, to rule out other conditions. He or she may also:  Test a sample of your mucus for bacterial infection.  Check the level of oxygen in  your blood. This is done to check for pneumonia.  Do a chest X-ray or lung function testing to rule out pneumonia and other conditions.  Perform blood tests.  Your health care provider will also ask about your symptoms and medical history. How is this treated? Most cases of acute bronchitis clear up over time without treatment. Your health care provider may recommend:  Drinking more fluids. Drinking more makes your mucus thinner, which may make it easier to breathe.  Taking a medicine for a fever or cough.  Taking an antibiotic medicine.  Using an inhaler to help improve shortness of breath and to control a cough.  Using a cool mist vaporizer or humidifier to make it easier to breathe.  Follow these instructions at home: Medicines  Take over-the-counter and prescription medicines only as told by your health care provider.  If you were prescribed an antibiotic, take it as told by your health care provider. Do not stop taking the antibiotic even if you start to feel better. General instructions  Get plenty of rest.  Drink enough fluids to keep your urine clear or pale yellow.  Avoid smoking and secondhand smoke. Exposure to  cigarette smoke or irritating chemicals will make bronchitis worse. If you smoke and you need help quitting, ask your health care provider. Quitting smoking will help your lungs heal faster.  Use an inhaler, cool mist vaporizer, or humidifier as told by your health care provider.  Keep all follow-up visits as told by your health care provider. This is important. How is this prevented? To lower your risk of getting this condition again:  Wash your hands often with soap and water. If soap and water are not available, use hand sanitizer.  Avoid contact with people who have cold symptoms.  Try not to touch your hands to your mouth, nose, or eyes.  Make sure to get the flu shot every year.  Contact a health care provider if:  Your symptoms do not improve  in 2 weeks of treatment. Get help right away if:  You cough up blood.  You have chest pain.  You have severe shortness of breath.  You become dehydrated.  You faint or keep feeling like you are going to faint.  You keep vomiting.  You have a severe headache.  Your fever or chills gets worse. This information is not intended to replace advice given to you by your health care provider. Make sure you discuss any questions you have with your health care provider. Document Released: 01/06/2005 Document Revised: 06/23/2016 Document Reviewed: 05/19/2016 Elsevier Interactive Patient Education  2018 [employer].          Visit summary with medication list and pertinent instructions was printed for patient to review. All questions at time of visit were answered - patient instructed to contact office with any additional concerns or updates. ER/RTC precautions were reviewed with the patient.   Follow-up plan: Return if symptoms worsen or fail to improve, and as directed by PCP for routine care .    Please note: voice recognition software was used to produce this document, and typos may escape review. Please contact Dr. Sheppard Coil for any needed clarifications.

## 2018-11-20 NOTE — Patient Instructions (Addendum)
Medications & Home Remedies for Upper Respiratory Illness   Aches/Pains, Fever, Headache OTC Acetaminophen (Tylenol) 500 mg tablets - take max 2 tablets (1000 mg) every 6 hours (4 times per day)  OTC Ibuprofen (Motrin) 200 mg tablets - take max 4 tablets (800 mg) every 6 hours*   Sinus Congestion OTC Nasal Saline if desired to rinse OTC Oxymetolazone (Afrin, others) sparing use due to rebound congestion, NEVER use in kids OTC Phenylephrine (Sudafed) 10 mg tablets every 4 hours (or the 12-hour formulation)* OTC Diphenhydramine (Benadryl) 25 mg tablets - take max 2 tablets every 4 hours   Cough & Sore Throat Prescription cough pills or syrups as directed OTC Dextromethorphan (Robitussin, others) - cough suppressant OTC Guaifenesin (Robitussin, Mucinex, others) - expectorant (helps cough up mucus) OTC Lozenges w/ Benzocaine + Menthol (Cepacol) Honey - as much as you want! Teas which "coat the throat" - look for ingredients Elm Bark, Licorice Root, Marshmallow Root   Other Prescription Oral Steroids to decrease inflammation and improve energy Prescription Antibiotics if these are necessary for bacterial infection - take ALL, even if you're feeling better  OTC Zinc Lozenges within 24 hours of symptoms onset - mixed evidence this shortens the duration of the common cold Don't waste your money on Vitamin C or Echinacea in acute illness - it's already too late!            Acute Bronchitis, Adult Acute bronchitis is sudden (acute) swelling of the air tubes (bronchi) in the lungs. Acute bronchitis causes these tubes to fill with mucus, which can make it hard to breathe. It can also cause coughing or wheezing. In adults, acute bronchitis usually goes away within 2 weeks. A cough caused by bronchitis may last up to 3 weeks. Smoking, allergies, and asthma can make the condition worse. Repeated episodes of bronchitis may cause further lung problems, such as chronic obstructive pulmonary  disease (COPD). What are the causes? This condition can be caused by germs and by substances that irritate the lungs, including:  Cold and flu viruses. This condition is most often caused by the same virus that causes a cold.  Bacteria.  Exposure to tobacco smoke, dust, fumes, and air pollution.  What increases the risk? This condition is more likely to develop in people who:  Have close contact with someone with acute bronchitis.  Are exposed to lung irritants, such as tobacco smoke, dust, fumes, and vapors.  Have a weak immune system.  Have a respiratory condition such as asthma.  What are the signs or symptoms? Symptoms of this condition include:  A cough.  Coughing up clear, yellow, or green mucus.  Wheezing.  Chest congestion.  Shortness of breath.  A fever.  Body aches.  Chills.  A sore throat.  How is this diagnosed? This condition is usually diagnosed with a physical exam. During the exam, your health care provider may order tests, such as chest X-rays, to rule out other conditions. He or she may also:  Test a sample of your mucus for bacterial infection.  Check the level of oxygen in your blood. This is done to check for pneumonia.  Do a chest X-ray or lung function testing to rule out pneumonia and other conditions.  Perform blood tests.  Your health care provider will also ask about your symptoms and medical history. How is this treated? Most cases of acute bronchitis clear up over time without treatment. Your health care provider may recommend:  Drinking more fluids. Drinking more makes your  mucus thinner, which may make it easier to breathe.  Taking a medicine for a fever or cough.  Taking an antibiotic medicine.  Using an inhaler to help improve shortness of breath and to control a cough.  Using a cool mist vaporizer or humidifier to make it easier to breathe.  Follow these instructions at home: Medicines  Take over-the-counter and  prescription medicines only as told by your health care provider.  If you were prescribed an antibiotic, take it as told by your health care provider. Do not stop taking the antibiotic even if you start to feel better. General instructions  Get plenty of rest.  Drink enough fluids to keep your urine clear or pale yellow.  Avoid smoking and secondhand smoke. Exposure to cigarette smoke or irritating chemicals will make bronchitis worse. If you smoke and you need help quitting, ask your health care provider. Quitting smoking will help your lungs heal faster.  Use an inhaler, cool mist vaporizer, or humidifier as told by your health care provider.  Keep all follow-up visits as told by your health care provider. This is important. How is this prevented? To lower your risk of getting this condition again:  Wash your hands often with soap and water. If soap and water are not available, use hand sanitizer.  Avoid contact with people who have cold symptoms.  Try not to touch your hands to your mouth, nose, or eyes.  Make sure to get the flu shot every year.  Contact a health care provider if:  Your symptoms do not improve in 2 weeks of treatment. Get help right away if:  You cough up blood.  You have chest pain.  You have severe shortness of breath.  You become dehydrated.  You faint or keep feeling like you are going to faint.  You keep vomiting.  You have a severe headache.  Your fever or chills gets worse. This information is not intended to replace advice given to you by your health care provider. Make sure you discuss any questions you have with your health care provider. Document Released: 01/06/2005 Document Revised: 06/23/2016 Document Reviewed: 05/19/2016 Elsevier Interactive Patient Education  [employer].

## 2019-02-01 ENCOUNTER — Ambulatory Visit: Payer: BC Managed Care – PPO | Admitting: Family Medicine

## 2019-02-01 ENCOUNTER — Encounter: Payer: Self-pay | Admitting: Family Medicine

## 2019-02-01 VITALS — BP 136/78 | HR 75 | Ht 62.0 in | Wt 176.0 lb

## 2019-02-01 DIAGNOSIS — F430 Acute stress reaction: Secondary | ICD-10-CM | POA: Diagnosis not present

## 2019-02-01 MED ORDER — CITALOPRAM HYDROBROMIDE 20 MG PO TABS: ORAL_TABLET | ORAL | 2 refills | Status: DC

## 2019-02-01 NOTE — Progress Notes (Signed)
Acute Office Visit  Subjective:    Patient ID: Lorraine Ayers, female    DOB: [birth year], 48 y.o.   MRN: [id number]  Chief Complaint  Patient presents with  . mood  . Insomnia    HPI Patient is in today for stress.  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  She just has a lot going on at some is the end of the year she also has significant her dissertation at the end of the month.  So she has been under a little bit more stress.  She gets more anxious and irritable for the 2 weeks around her menstrual cycyle.  Found that she snapped that a couple of employees last week and says that is not like her at all.  She is usually very mindful about what she says.  Not sleeping well.  Says feels her mind racing and will often fall asleep on the cough.  Goes to bed around 8:30-9 and gets up around 4:30.  She has been on celexa on and off since 2012.  He does not want to use a sleeping medication but just wants me to be aware of what is going on.  Is even on the weekends when normally she is motivated in doing things she is just wanted to sit and do nothing and watch TV.  Again very much not like her.  In the past she has struggled with some depression but says she really does not feel sad or down she just feels more anxious.  Says she does try to actively reduce her stress levels.  She walks daily at least 15,000 steps.  She goes and gets a massage once a month to help relieve the tension in her neck and shoulders.  Had flu shot done throught October.   No past medical history on file.  Past Surgical History:  Procedure Laterality Date  . CESAREAN SECTION  5-93  . KNEE SURGERY  2008   acl REPAIR ON LEFT KNEE.   . TUBAL LIGATION  12-98    Family History  Problem Relation Age of Onset  . Heart attack Father   . Hyperlipidemia Father   . Hypertension Father   . Alcohol abuse Other   . Dementia Mother   . Uterine cancer Mother     Social History   Socioeconomic History  . Marital status:  Single    Spouse name: Not on file  . Number of children: Not on file  . Years of education: Not on file  . Highest education level: Not on file  Occupational History  . Not on file  Social Needs  . Financial resource strain: Not on file  . Food insecurity:    Worry: Not on file    Inability: Not on file  . Transportation needs:    Medical: Not on file    Non-medical: Not on file  Tobacco Use  . Smoking status: Never Smoker  . Smokeless tobacco: Never Used  Substance and Sexual Activity  . Alcohol use: Yes    Alcohol/week: 1.0 standard drinks    Types: 1 Cans of beer per week  . Drug use: No  . Sexual activity: Yes  Lifestyle  . Physical activity:    Days per week: Not on file    Minutes per session: Not on file  . Stress: Not on file  Relationships  . Social connections:    Talks on phone: Not on file    Gets together: Not on file  Attends religious service: Not on file    Active member of club or organization: Not on file    Attends meetings of clubs or organizations: Not on file    Relationship status: Not on file  . Intimate partner violence:    Fear of current or ex partner: Not on file    Emotionally abused: Not on file    Physically abused: Not on file    Forced sexual activity: Not on file  Other Topics Concern  . Not on file  Social History Narrative  . Not on file    Outpatient Medications Prior to Visit  Medication Sig Dispense Refill  . hydrochlorothiazide (HYDRODIURIL) 25 MG tablet TAKE 1 TABLET BY MOUTH EVERY DAY 90 tablet 1  . guaiFENesin-codeine (ROBITUSSIN AC) 100-10 MG/5ML syrup Take 5-10 mLs by mouth 4 (four) times daily as needed for cough. 118 mL 0   No facility-administered medications prior to visit.     No Known Allergies  ROS     Objective:    Physical Exam  Constitutional: She is oriented to person, place, and time. She appears well-developed and well-nourished.  HENT:  Head: Normocephalic and atraumatic.  Cardiovascular:  Normal rate, regular rhythm and normal heart sounds.  Pulmonary/Chest: Effort normal and breath sounds normal.  Neurological: She is alert and oriented to person, place, and time.  Skin: Skin is warm and dry.  Psychiatric: She has a normal mood and affect. Her behavior is normal.    BP 136/78   Pulse 75   Ht 5\' 2"  (1.575 m)   Wt 176 lb (79.8 kg)   SpO2 99%   BMI 32.19 kg/m  Wt Readings from Last 3 Encounters:  02/01/19 176 lb (79.8 kg)  11/20/18 178 lb (80.7 kg)  05/01/18 179 lb (81.2 kg)    Health Maintenance Due  Topic Date Due  . HIV Screening  05/19/1984    There are no preventive care reminders to display for this patient.   Lab Results  Component Value Date   TSH 1.60 06/28/2017   Lab Results  Component Value Date   WBC 7.1 06/28/2017   HGB 9.5 (L) 06/28/2017   HCT 31.2 (L) 06/28/2017   MCV 69.8 (L) 06/28/2017   PLT 306 06/28/2017   Lab Results  Component Value Date   NA 140 05/01/2018   K 4.4 05/01/2018   CO2 28 05/01/2018   GLUCOSE 95 05/01/2018   BUN 15 05/01/2018   CREATININE 0.97 05/01/2018   BILITOT 0.4 06/18/2016   ALKPHOS 71 06/18/2016   AST 13 06/18/2016   ALT 10 06/18/2016   PROT 7.0 06/18/2016   ALBUMIN 4.0 06/18/2016   CALCIUM 9.3 05/01/2018   Lab Results  Component Value Date   CHOL 208 (H) 06/28/2017   Lab Results  Component Value Date   HDL 65 06/28/2017   No results found for: [facility] Lab Results  Component Value Date   TRIG 141 06/28/2017   Lab Results  Component Value Date   CHOLHDL 3.2 06/28/2017   No results found for: HGBA1C     Assessment & Plan:   Problem List Items Addressed This Visit    None    Visit Diagnoses    Reaction, situational, acute, to stress    -  Primary   Relevant Medications   citalopram (CELEXA) 20 MG tablet     Acute stress reaction-New problem.  Gad 7 score of 12 today and PHQ 9 score of 9.  Rates her symptoms  is somewhat difficult.  Denies any thoughts of wanting to harm herself.   She has some significant elements of depression and anxiety.  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  After discussion she agreed to do both.  She does walk about 15,000 steps per day so stays very physically active.  Meds ordered this encounter  Medications  . citalopram (CELEXA) 20 MG tablet    Sig: TAKE 1/2 TAB BY MOUTH EVERY DAY FOR 1 WEEK, THEN INCREASE TO 1 TABLET ONCE A DAY.    Dispense:  30 tablet    Refill:  2     Beatrice Lecher, MD

## 2019-05-03 ENCOUNTER — Other Ambulatory Visit: Payer: Self-pay | Admitting: Family Medicine

## 2019-05-03 ENCOUNTER — Encounter: Payer: Self-pay | Admitting: Family Medicine

## 2019-05-03 ENCOUNTER — Ambulatory Visit (INDEPENDENT_AMBULATORY_CARE_PROVIDER_SITE_OTHER): Payer: BC Managed Care – PPO | Admitting: Family Medicine

## 2019-05-03 ENCOUNTER — Other Ambulatory Visit: Payer: Self-pay

## 2019-05-03 ENCOUNTER — Ambulatory Visit (INDEPENDENT_AMBULATORY_CARE_PROVIDER_SITE_OTHER): Payer: BC Managed Care – PPO

## 2019-05-03 VITALS — BP 139/93 | HR 104 | Ht 62.0 in | Wt 170.0 lb

## 2019-05-03 DIAGNOSIS — M5416 Radiculopathy, lumbar region: Secondary | ICD-10-CM

## 2019-05-03 DIAGNOSIS — R635 Abnormal weight gain: Secondary | ICD-10-CM

## 2019-05-03 DIAGNOSIS — S39012A Strain of muscle, fascia and tendon of lower back, initial encounter: Secondary | ICD-10-CM | POA: Diagnosis not present

## 2019-05-03 DIAGNOSIS — I10 Essential (primary) hypertension: Secondary | ICD-10-CM

## 2019-05-03 MED ORDER — GABAPENTIN 300 MG PO CAPS
ORAL_CAPSULE | ORAL | 3 refills | Status: DC
Start: 2019-05-03 — End: 2019-10-24

## 2019-05-03 NOTE — Patient Instructions (Signed)
Thank you for coming in today. Get xray now on your way out.  I will order MRI as a result.  Try to get the CD of images from your chiropractor.  Recheck after MRI.  Take gabapentin initially at bedtime.  Ok to increase as needed.    Radicular Pain Radicular pain is a type of pain that spreads from your back or neck along a spinal nerve. Spinal nerves are nerves that leave the spinal cord and go to the muscles. Radicular pain is sometimes called radiculopathy, radiculitis, or a pinched nerve. When you have this type of pain, you may also have weakness, numbness, or tingling in the area of your body that is supplied by the nerve. The pain may feel sharp and burning. Depending on which spinal nerve is affected, the pain may occur in the:  Neck area (cervical radicular pain). You may also feel pain, numbness, weakness, or tingling in the arms.  Mid-spine area (thoracic radicular pain). You would feel this pain in the back and chest. This type is rare.  Lower back area (lumbar radicular pain). You would feel this pain as low back pain. You may feel pain, numbness, weakness, or tingling in the buttocks or legs. Sciatica is a type of lumbar radicular pain that shoots down the back of the leg. Radicular pain occurs when one of the spinal nerves becomes irritated or squeezed (compressed). It is often caused by something pushing on a spinal nerve, such as one of the bones of the spine (vertebrae) or one of the round cushions between vertebrae (intervertebral disks). This can result from:  An injury.  Wear and tear or aging of a disk.  The growth of a bone spur that pushes on the nerve. Radicular pain often goes away when you follow instructions from your health care provider for relieving pain at home. Follow these instructions at home: Managing pain      If directed, put ice on the affected area: ? Put ice in a plastic bag. ? Place a towel between your skin and the bag. ? Leave the ice on  for 20 minutes, 2-3 times a day.  If directed, apply heat to the affected area as often as told by your health care provider. Use the heat source that your health care provider recommends, such as a moist heat pack or a heating pad. ? Place a towel between your skin and the heat source. ? Leave the heat on for 20-30 minutes. ? Remove the heat if your skin turns bright red. This is especially important if you are unable to feel pain, heat, or cold. You may have a greater risk of getting burned. Activity   Do not sit or rest in bed for long periods of time.  Try to stay as active as possible. Ask your health care provider what type of exercise or activity is best for you.  Avoid activities that make your pain worse, such as bending and lifting.  Do not lift anything that is heavier than 10 lb (4.5 kg), or the limit that you are told, until your health care provider says that it is safe.  Practice using proper technique when lifting items. Proper lifting technique involves bending your knees and rising up.  Do strength and range-of-motion exercises only as told by your health care provider or physical therapist. General instructions  Take over-the-counter and prescription medicines only as told by your health care provider.  Pay attention to any changes in your symptoms.  Keep all follow-up visits as told by your health care provider. This is important. ? Your health care provider may send you to a physical therapist to help with this pain. Contact a health care provider if:  Your pain and other symptoms get worse.  Your pain medicine is not helping.  Your pain has not improved after a few weeks of home care.  You have a fever. Get help right away if:  You have severe pain, weakness, or numbness.  You have difficulty with bladder or bowel control. Summary  Radicular pain is a type of pain that spreads from your back or neck along a spinal nerve.  When you have radicular  pain, you may also have weakness, numbness, or tingling in the area of your body that is supplied by the nerve.  The pain may feel sharp or burning.  Radicular pain may be treated with ice, heat, medicines, or physical therapy. This information is not intended to replace advice given to you by your health care provider. Make sure you discuss any questions you have with your health care provider. Document Released: 01/06/2005 Document Revised: 06/13/2018 Document Reviewed: 06/13/2018 Elsevier Interactive Patient Education  2019 [employer].

## 2019-05-03 NOTE — Progress Notes (Signed)
Lorraine Ayers is a 48 y.o. female who presents to [place] today for low back pain and buttock pain.   Starting in about 2 months ago she developed pain in her low back radiating down to her right leg.  She has been seen by chiropractor multiple times for at least 6 weeks with this issue.  She had x-rays at the chiropractor that she reports were negative.  She notes the chiropractor has helped the pain in her upper lumbar spine and some of the pain radiating down her leg.  However she continues to have pain radiating down her right leg to the lateral calf as well as pain located in her buttocks.  She does this is quite painful.  She rates the pain is moderate to severe.  She notes pain is worse with activity and better with rest.  She also feels better if I flex for positioning but notes that prolonged sitting is painful.  She has extension tends to worsen her pain.  She is tried icy hot TENS unit and heating pad cushion which is helped only a little bit   She denies bowel bladder dysfunction weakness or significant numbness distally.  Denies any injury.    ROS:  As above  Exam:  BP (!) 139/93   Pulse (!) 104   Ht 5\' 2"  (1.575 m)   Wt 170 lb (77.1 kg)   SpO2 98%   BMI 31.09 kg/m  Wt Readings from Last 5 Encounters:  05/03/19 170 lb (77.1 kg)  02/01/19 176 lb (79.8 kg)  11/20/18 178 lb (80.7 kg)  05/01/18 179 lb (81.2 kg)  06/28/17 182 lb (82.6 kg)   General: Well Developed, well nourished, and in no acute distress.  Neuro/Psych: Alert and oriented x3, extra-ocular muscles intact, able to move all 4 extremities, sensation grossly intact. Skin: Warm and dry, no rashes noted.  Respiratory: Not using accessory muscles, speaking in full sentences, trachea midline.  Cardiovascular: Pulses palpable, no extremity edema. Abdomen: Does not appear distended. MSK:  Lumbosacral-spine: Normal-appearing nontender to spinal midline. Normal  flexion.  Some pain with rotation and lateral flexion bilaterally.  Significant pain and discomfort with extension. Lower extremity strength is equal and normal throughout bilateral lower extremities. Reflexes are equal normal throughout bilateral extremities. Sensation is intact throughout. Positive slump test right side. Antalgic gait present.    Lab and Radiology Results No results found for this or any previous visit (from the past 72 hour(s)). Dg Sacrum/coccyx  Result Date: 05/03/2019 CLINICAL DATA:  Right side sciatica pain EXAM: SACRUM AND COCCYX - 2+ VIEW COMPARISON:  None. FINDINGS: There is no evidence of fracture or other focal bone lesions. Hip joints and SI joints are symmetric and unremarkable. IMPRESSION: Negative. Electronically Signed   By: Rolm Baptise M.D.   On: 05/03/2019 13:02   I personally (independently) visualized and performed the interpretation of the images attached in this note.     Assessment and Plan: 48 y.o. female with  Low back pain with right-sided radiculopathy.  Symptoms ongoing now for over 2 months and failing typical conservative management.  She is had chiropractic care for at least 6 weeks.  X-rays are now negative.  At this point I think it is reasonable to proceed to MRI for further evaluation and treatment planning.  Neck step after MRI will likely be epidural steroid injection.  Trial of gabapentin in the meantime.  Recheck following MRI.   PDMP not reviewed this encounter.  Orders Placed This Encounter  Procedures  . DG Sacrum/Coccyx    Standing Status:   Future    Number of Occurrences:   1    Standing Expiration Date:   07/02/2020    Order Specific Question:   Reason for Exam (SYMPTOM  OR DIAGNOSIS REQUIRED)    Answer:   eval sacrum pain    Order Specific Question:   Is patient pregnant?    Answer:   No    Order Specific Question:   Preferred imaging location?    Answer:   [facility]    Order Specific Question:    Radiology Contrast Protocol - do NOT remove file path    Answer:   \\[email or web address]  . MR Lumbar Spine Wo Contrast    Standing Status:   Future    Standing Expiration Date:   07/02/2020    Order Specific Question:   What is the patient's sedation requirement?    Answer:   No Sedation    Order Specific Question:   Does the patient have a pacemaker or implanted devices?    Answer:   No    Order Specific Question:   Preferred imaging location?    Answer:   [facility] (table limit-350lbs)    Order Specific Question:   Radiology Contrast Protocol - do NOT remove file path    Answer:   \\[email or web address]   Meds ordered this encounter  Medications  . gabapentin (NEURONTIN) 300 MG capsule    Sig: One tab PO qHS for a week, then BID for a week, then TID. May double weekly to a max of 3,600mg /day    Dispense:  180 capsule    Refill:  3    Historical information moved to improve visibility of documentation.  No past medical history on file. Past Surgical History:  Procedure Laterality Date  . CESAREAN SECTION  5-93  . KNEE SURGERY  2008   acl REPAIR ON LEFT KNEE.   . TUBAL LIGATION  12-98   Social History   Tobacco Use  . Smoking status: Never Smoker  . Smokeless tobacco: Never Used  Substance Use Topics  . Alcohol use: Yes    Alcohol/week: 1.0 standard drinks    Types: 1 Cans of beer per week   family history includes Alcohol abuse in an other family member; Dementia in her mother; Heart attack in her father; Hyperlipidemia in her father; Hypertension in her father; Uterine cancer in her mother.  Medications: Current Outpatient Medications  Medication Sig Dispense Refill  . citalopram (CELEXA) 20 MG tablet Take 1 tablet (20 mg total) by mouth daily. 90 tablet 1  . gabapentin (NEURONTIN) 300 MG capsule One tab PO qHS for a week, then BID for a week, then TID. May double weekly to a max of 3,600mg /day  180 capsule 3  . hydrochlorothiazide (HYDRODIURIL) 25 MG tablet TAKE 1 TABLET BY MOUTH EVERY DAY 90 tablet 1   No current facility-administered medications for this visit.    No Known Allergies    Discussed warning signs or symptoms. Please see discharge instructions. Patient expresses understanding.

## 2019-05-08 ENCOUNTER — Other Ambulatory Visit: Payer: Self-pay

## 2019-05-08 ENCOUNTER — Encounter: Payer: Self-pay | Admitting: Family Medicine

## 2019-05-08 ENCOUNTER — Ambulatory Visit (INDEPENDENT_AMBULATORY_CARE_PROVIDER_SITE_OTHER): Payer: BC Managed Care – PPO

## 2019-05-08 ENCOUNTER — Ambulatory Visit (INDEPENDENT_AMBULATORY_CARE_PROVIDER_SITE_OTHER): Payer: BC Managed Care – PPO | Admitting: Family Medicine

## 2019-05-08 VITALS — BP 119/84 | HR 52 | Temp 98.7°F | Ht 62.0 in | Wt 167.0 lb

## 2019-05-08 DIAGNOSIS — F430 Acute stress reaction: Secondary | ICD-10-CM

## 2019-05-08 DIAGNOSIS — S39012A Strain of muscle, fascia and tendon of lower back, initial encounter: Secondary | ICD-10-CM | POA: Diagnosis not present

## 2019-05-08 DIAGNOSIS — I10 Essential (primary) hypertension: Secondary | ICD-10-CM

## 2019-05-08 DIAGNOSIS — M5416 Radiculopathy, lumbar region: Secondary | ICD-10-CM

## 2019-05-08 NOTE — Progress Notes (Signed)
Pt reports that the medication is working well.Lorraine Ayers, [place]

## 2019-05-08 NOTE — Progress Notes (Signed)
Virtual Visit via Video Note  I connected with Lorraine Ayers on 05/08/19 at  8:10 AM EDT by a video enabled telemedicine application and verified that I am speaking with the correct person using two identifiers.   I discussed the limitations of evaluation and management by telemedicine and the availability of in person appointments. The patient expressed understanding and agreed to proceed. Pt was at home and I was in my office for the virtual visit.     Subjective:    CC: 35-month follow-up for mood and sleep  HPI: 86-month follow-up for acute stress-when I last saw her in February she was having significant elements of depression and anxiety.  She had been on citalopram for a couple of weeks at that point and been working with a English as a second language teacher.  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  Currently on citalopram 20 mg daily.  She has sleeping better overall.   She did injure her back.  Saw Dr. Georgina Snell.  Scheduled for an MRI this afternoon and then will follow up with him for results.  Hypertension- Pt denies chest pain, SOB, dizziness, or heart palpitations.  Taking meds as directed w/o problems.  Denies medication side effects.    She says she has been working on trying to lose weight.  She is down about 11 to 12 pounds.  She has been trying to eat more healthy she is really reduce her salt intake and has been walking for exercise.  Ultimately her goal would be to lose 10 more pounds.  Past medical history, Surgical history, Family history not pertinant except as noted below, Social history, Allergies, and medications have been entered into the medical record, reviewed, and corrections made.   Review of Systems: No fevers, chills, night sweats, weight loss, chest pain, or shortness of breath.   Objective:    General: Speaking clearly in complete sentences without any shortness of breath.  Alert and oriented x3.  Normal judgment. No apparent  acute distress. Well groomed.     Impression and Recommendations:    Acute stress - well controlled on her current regimen.  She would like to continue the medication instead of weaning it off.  She feels like it just really helps keep her even balanced.*Plan will be to continue with medication for now and follow-up in 6 months.  HTN - Well controlled. Continue current regimen. Follow up in  6 months.  She is working on losing weight and exercising if her blood pressure starts to drop too low or she is noticing she is getting lightheaded etc. then please let us know.  She has been working on lowering her weight. She is down greater than 10 lbs. She has been doing her self care.  Plan is to lose about 10 more pounds.     I discussed the assessment and treatment plan with the patient. The patient was provided an opportunity to ask questions and all were answered. The patient agreed with the plan and demonstrated an understanding of the instructions.   The patient was advised to call back or seek an in-person evaluation if the symptoms worsen or if the condition fails to improve as anticipated.   Beatrice Lecher, MD

## 2019-05-09 ENCOUNTER — Telehealth: Payer: Self-pay | Admitting: Family Medicine

## 2019-05-09 DIAGNOSIS — M5416 Radiculopathy, lumbar region: Secondary | ICD-10-CM

## 2019-05-09 NOTE — Telephone Encounter (Signed)
ESI ordered for back pain in response to MRI

## 2019-05-22 ENCOUNTER — Ambulatory Visit
Admission: RE | Admit: 2019-05-22 | Discharge: 2019-05-22 | Disposition: A | Discharge status: Home / Self Care | Payer: BC Managed Care – PPO | Source: Ambulatory Visit | Attending: Family Medicine | Admitting: Family Medicine

## 2019-05-22 ENCOUNTER — Other Ambulatory Visit: Payer: Self-pay

## 2019-05-22 MED ORDER — IOPAMIDOL (ISOVUE-M 200) INJECTION 41%
1.0000 mL | Freq: Once | INTRAMUSCULAR | Status: AC
  Administered 2019-05-22: 1 mL via EPIDURAL

## 2019-05-22 MED ORDER — METHYLPREDNISOLONE ACETATE 40 MG/ML INJ SUSP (RADIOLOG
120.0000 mg | Freq: Once | INTRAMUSCULAR | Status: AC
  Administered 2019-05-22: 120 mg via EPIDURAL

## 2019-05-22 NOTE — Discharge Instructions (Signed)

## 2019-06-13 ENCOUNTER — Encounter: Payer: Self-pay | Admitting: Family Medicine

## 2019-06-13 ENCOUNTER — Telehealth: Payer: Self-pay | Admitting: Family Medicine

## 2019-06-13 ENCOUNTER — Ambulatory Visit (INDEPENDENT_AMBULATORY_CARE_PROVIDER_SITE_OTHER): Payer: BC Managed Care – PPO | Admitting: Family Medicine

## 2019-06-13 ENCOUNTER — Other Ambulatory Visit: Payer: Self-pay

## 2019-06-13 VITALS — Temp 98.0°F | Ht 62.0 in | Wt 167.0 lb

## 2019-06-13 DIAGNOSIS — M5416 Radiculopathy, lumbar region: Secondary | ICD-10-CM | POA: Diagnosis not present

## 2019-06-13 NOTE — Progress Notes (Signed)
Virtual Visit  via Video Note  I connected with      Almon Hercules by a video enabled telemedicine application and verified that I am speaking with the correct person using two identifiers.   I discussed the limitations of evaluation and management by telemedicine and the availability of in person appointments. The patient expressed understanding and agreed to proceed.  History of Present Illness: Lorraine Ayers is a 48 y.o. female who would like to discuss pain in back and leg.   Patient was seen in late May for back pain radiating down right leg.  She failed conservative management and MRI confirmed bulging disc compressing right S1 nerve root.  She had epidural steroid injection on June 9.  She notes that she is much improved from where she was in late May however she continues to experience moderate pain.  She notes continued pain radiating down her right leg.  Additionally she notes pain in her low back and difficulty with full extension.  She has to walk with a forward flexed position in order to be comfortable.  She denies any weakness or numbness loss of function or bowel bladder dysfunction.  Overall she is improved but not back to normal.   Observations/Objective: Temp 98 F (36.7 C) (Oral)   Ht 5\' 2"  (1.575 m)   Wt 167 lb (75.8 kg)   BMI 30.54 kg/m  Wt Readings from Last 5 Encounters:  06/13/19 167 lb (75.8 kg)  05/08/19 167 lb (75.8 kg)  05/03/19 170 lb (77.1 kg)  02/01/19 176 lb (79.8 kg)  11/20/18 178 lb (80.7 kg)   Exam: Appearance nontoxic no acute distress Normal Speech.    Lab and Radiology Results Dg Sacrum/coccyx  Result Date: 05/03/2019 CLINICAL DATA:  Right side sciatica pain EXAM: SACRUM AND COCCYX - 2+ VIEW COMPARISON:  None. FINDINGS: There is no evidence of fracture or other focal bone lesions. Hip joints and SI joints are symmetric and unremarkable. IMPRESSION: Negative. Electronically Signed   By: Rolm Baptise M.D.   On: 05/03/2019 13:02   Mr  Lumbar Spine Wo Contrast  Result Date: 05/09/2019 CLINICAL DATA:  Initial evaluation for low back pain for 3 months, posterior right leg weakness, pain, and numbness. EXAM: MRI LUMBAR SPINE WITHOUT CONTRAST TECHNIQUE: Multiplanar, multisequence MR imaging of the lumbar spine was performed. No intravenous contrast was administered. COMPARISON:  None available. FINDINGS: Segmentation: Standard. Lowest well-formed disc labeled the L5-S1 level. Alignment: Mild straightening of the normal lumbar lordosis. No listhesis. Vertebrae: Vertebral body heights well maintained without evidence for acute or chronic fracture. Bone marrow signal intensity within normal limits. Few scattered subcentimeter benign hemangiomas noted. No other discrete or worrisome osseous lesions. Reactive endplate changes present about the L5-S1 interspace. No other abnormal marrow edema. Conus medullaris and cauda equina: Conus extends to the L1 level. Conus and cauda equina appear normal. Paraspinal and other soft tissues: Paraspinous soft tissues within normal limits. Visualized visceral structures are normal. Disc levels: L1-2:  Unremarkable. L2-3:  Unremarkable. L3-4:  Unremarkable. L4-5: Disc desiccation without significant disc bulge. No stenosis or impingement. L5-S1: Disc desiccation with intervertebral disc space narrowing. Superimposed moderate-sized central disc protrusion contacts and impinges upon the descending S1 nerve roots bilaterally (series 9, image 37). Moderate bilateral lateral recess stenosis. Distal thecal sac remains patent. No foraminal stenosis. IMPRESSION: 1. Central disc protrusion at L5-S1, impinging upon the descending S1 nerve roots bilaterally in the lateral recesses. 2. Otherwise normal MRI of the lumbar spine. Electronically Signed  By: Jeannine Boga M.D.   On: 05/09/2019 02:59   Dg Inject Diag/thera/inc Needle/cath/plc Epi/lumb/sac W/img  Result Date: 05/22/2019 CLINICAL DATA:  Lumbosacral spondylosis  without myelopathy. Low back and right leg pain. L5-S1 disc protrusion. FLUOROSCOPY TIME:  Fluoroscopy Time: 4 seconds Radiation Exposure Index: 8.03 microGray*m^2 PROCEDURE: The procedure, risks, benefits, and alternatives were explained to the patient. Questions regarding the procedure were encouraged and answered. The patient understands and consents to the procedure. LUMBAR EPIDURAL INJECTION: An interlaminar approach was performed on the right at L5-S1. The overlying skin was cleansed and anesthetized. A 3.5 inch 20 gauge epidural needle was advanced using loss-of-resistance technique. DIAGNOSTIC EPIDURAL INJECTION: Injection of Isovue-M 200 shows a good epidural pattern with spread above and below the level of needle placement, primarily on the right. No vascular opacification is seen. THERAPEUTIC EPIDURAL INJECTION: 120 mg of Depo-Medrol mixed with 3 mL of 1% lidocaine were instilled. The procedure was well-tolerated, and the patient was discharged thirty minutes following the injection in good condition. COMPLICATIONS: None IMPRESSION: Technically successful lumbar interlaminar epidural injection on the right at L5-S1. Electronically Signed   By: Logan Bores M.D.   On: 05/22/2019 08:35   I personally (independently) visualized and performed the interpretation of the images attached in this note.   Assessment and Plan: 48 y.o. female with sciatica and low back pain.  Significant improvement with epidural steroid injection but not back to normal.  At this point reasonable to repeat epidural steroid injection.  Also given her now pain and dysfunction more in her low back I think is also reasonable to proceed with trial of physical therapy again.  Recheck if not improving.  Precautions reviewed.  PDMP not reviewed this encounter. Orders Placed This Encounter  Procedures  . DG INJECT DIAG/THERA/INC NEEDLE/CATH/PLC EPI/LUMB/SAC W/IMG    LUMB EPI #2 BIL L5-S1 [employer]-no auth req per online//js 167 LBS  PACS (05/08/19) NO THINS/OTC*SCREENED*    Order Specific Question:   Reason for Exam (SYMPTOM  OR DIAGNOSIS REQUIRED)    Answer:   right L5-S1    Order Specific Question:   Is the patient pregnant?    Answer:   No    Order Specific Question:   Preferred Imaging Location?    Answer:   [id number]    Order Specific Question:   Radiology Contrast Protocol - do NOT remove file path    Answer:   [email or web address]  . Ambulatory referral to Physical Therapy    Referral Priority:   Routine    Referral Type:   Physical Medicine    Referral Reason:   Specialty Services Required    Requested Specialty:   Physical Therapy   No orders of the defined types were placed in this encounter.   Follow Up Instructions:    I discussed the assessment and treatment plan with the patient. The patient was provided an opportunity to ask questions and all were answered. The patient agreed with the plan and demonstrated an understanding of the instructions.   The patient was advised to call back or seek an in-person evaluation if the symptoms worsen or if the condition fails to improve as anticipated.  Time: 25 minutes of intraservice time, with >39 minutes of total time during today's visit.      Historical information moved to improve visibility of documentation.  No past medical history on file. Past Surgical History:  Procedure Laterality Date  . CESAREAN SECTION  5-93  . KNEE SURGERY  2008  acl REPAIR ON LEFT KNEE.   . TUBAL LIGATION  12-98   Social History   Tobacco Use  . Smoking status: Never Smoker  . Smokeless tobacco: Never Used  Substance Use Topics  . Alcohol use: Yes    Alcohol/week: 1.0 standard drinks    Types: 1 Cans of beer per week   family history includes Alcohol abuse in an other family member; Dementia in her mother; Heart attack in her father; Hyperlipidemia in her father; Hypertension in her father; Uterine cancer in her  mother.  Medications: Current Outpatient Medications  Medication Sig Dispense Refill  . citalopram (CELEXA) 20 MG tablet Take 1 tablet (20 mg total) by mouth daily. 90 tablet 1  . gabapentin (NEURONTIN) 300 MG capsule One tab PO qHS for a week, then BID for a week, then TID. May double weekly to a max of 3,600mg /day 180 capsule 3  . hydrochlorothiazide (HYDRODIURIL) 25 MG tablet TAKE 1 TABLET BY MOUTH EVERY DAY 90 tablet 1   No current facility-administered medications for this visit.    No Known Allergies

## 2019-06-13 NOTE — Telephone Encounter (Signed)
Ludwig Clarks at [facility]  [phone]) to get patient scheduled for Epidural Injection.

## 2019-06-21 ENCOUNTER — Other Ambulatory Visit: Payer: Self-pay

## 2019-06-21 ENCOUNTER — Ambulatory Visit
Admission: RE | Admit: 2019-06-21 | Discharge: 2019-06-21 | Disposition: A | Discharge status: Home / Self Care | Payer: BC Managed Care – PPO | Source: Ambulatory Visit | Attending: Family Medicine | Admitting: Family Medicine

## 2019-06-21 MED ORDER — IOPAMIDOL (ISOVUE-M 200) INJECTION 41%
1.0000 mL | Freq: Once | INTRAMUSCULAR | Status: AC
  Administered 2019-06-21: 1 mL via EPIDURAL

## 2019-06-21 MED ORDER — METHYLPREDNISOLONE ACETATE 40 MG/ML INJ SUSP (RADIOLOG
120.0000 mg | Freq: Once | INTRAMUSCULAR | Status: AC
  Administered 2019-06-21: 12:00:00 120 mg via EPIDURAL

## 2019-06-21 NOTE — Discharge Instructions (Signed)

## 2019-06-25 ENCOUNTER — Ambulatory Visit: Payer: BC Managed Care – PPO | Admitting: Rehabilitative and Restorative Service Providers"

## 2019-10-24 ENCOUNTER — Ambulatory Visit (INDEPENDENT_AMBULATORY_CARE_PROVIDER_SITE_OTHER): Payer: BC Managed Care – PPO | Admitting: Sports Medicine

## 2019-10-24 ENCOUNTER — Other Ambulatory Visit: Payer: Self-pay

## 2019-10-24 ENCOUNTER — Encounter: Payer: Self-pay | Admitting: Sports Medicine

## 2019-10-24 DIAGNOSIS — M51369 Other intervertebral disc degeneration, lumbar region without mention of lumbar back pain or lower extremity pain: Secondary | ICD-10-CM | POA: Insufficient documentation

## 2019-10-24 DIAGNOSIS — M5136 Other intervertebral disc degeneration, lumbar region: Secondary | ICD-10-CM

## 2019-10-24 NOTE — Assessment & Plan Note (Addendum)
Lorraine Ayers unfortunately has a fairly large L5-S1 disc protrusion causing moderate central canal stenosis. She has done very well so far with home rehab exercises, chiropractic manipulation and a couple of epidurals, radicular symptoms have resolved. Her pain is over the proximal sacrum but not reproducible with palpation suggesting that this is not a sacral stress injury, SI joint dysfunction, or coccydynia. She continues to have axial pain that I think is related to her disc protrusion. We are going to do 6 weeks of aggressive formal physical therapy before considering surgical intervention, we also are going to try a nonpharmacologic approach. Return to see me 6 weeks after physical therapy and we will come up with another plan.

## 2019-10-24 NOTE — Progress Notes (Signed)
Subjective:    CC: Low back pain  HPI: This is a very pleasant 48 year old female, she has chronic low back pain, a large L5-S1 disc protrusion on MRI, she is had a couple of epidurals that limited her radicular symptoms, unfortunately she continues to have axial pain over the proximal sacrum, no bowel or bladder dysfunction, no numbness, constitutional symptoms.  I reviewed the past medical history, family history, social history, surgical history, and allergies today and no changes were needed.  Please see the problem list section below in epic for further details.  Past Medical History: No past medical history on file. Past Surgical History: Past Surgical History:  Procedure Laterality Date  . CESAREAN SECTION  5-93  . KNEE SURGERY  2008   acl REPAIR ON LEFT KNEE.   . TUBAL LIGATION  12-98   Social History: Social History   Socioeconomic History  . Marital status: Married    Spouse name: Not on file  . Number of children: Not on file  . Years of education: Not on file  . Highest education level: Not on file  Occupational History  . Not on file  Social Needs  . Financial resource strain: Not on file  . Food insecurity    Worry: Not on file    Inability: Not on file  . Transportation needs    Medical: Not on file    Non-medical: Not on file  Tobacco Use  . Smoking status: Never Smoker  . Smokeless tobacco: Never Used  Substance and Sexual Activity  . Alcohol use: Yes    Alcohol/week: 1.0 standard drinks    Types: 1 Cans of beer per week  . Drug use: No  . Sexual activity: Yes  Lifestyle  . Physical activity    Days per week: Not on file    Minutes per session: Not on file  . Stress: Not on file  Relationships  . Social Herbalist on phone: Not on file    Gets together: Not on file    Attends religious service: Not on file    Active member of club or organization: Not on file    Attends meetings of clubs or organizations: Not on file   Relationship status: Not on file  Other Topics Concern  . Not on file  Social History Narrative  . Not on file   Family History: Family History  Problem Relation Age of Onset  . Heart attack Father   . Hyperlipidemia Father   . Hypertension Father   . Alcohol abuse Other   . Dementia Mother   . Uterine cancer Mother    Allergies: No Known Allergies Medications: See med rec.  Review of Systems: No fevers, chills, night sweats, weight loss, chest pain, or shortness of breath.   Objective:    General: Well Developed, well nourished, and in no acute distress.  Neuro: Alert and oriented x3, extra-ocular muscles intact, sensation grossly intact.  HEENT: Normocephalic, atraumatic, pupils equal round reactive to light, neck supple, no masses, no lymphadenopathy, thyroid nonpalpable.  Skin: Warm and dry, no rashes. Cardiac: Regular rate and rhythm, no murmurs rubs or gallops, no lower extremity edema.  Respiratory: Clear to auscultation bilaterally. Not using accessory muscles, speaking in full sentences. Back Exam:  Inspection: Unremarkable  Motion: Flexion 45 deg, Extension 45 deg, Side Bending to 45 deg bilaterally,  Rotation to 45 deg bilaterally  SLR laying: Negative  XSLR laying: Negative  Palpable tenderness: None, specifically no tenderness  at the site of referred pain. FABER: negative. Sensory change: Gross sensation intact to all lumbar and sacral dermatomes.  Reflexes: 2+ at both patellar tendons, 2+ at achilles tendons, Babinski's downgoing.  Strength at foot  Plantar-flexion: 5/5 Dorsi-flexion: 5/5 Eversion: 5/5 Inversion: 5/5  Leg strength  Quad: 5/5 Hamstring: 5/5 Hip flexor: 5/5 Hip abductors: 5/5  Gait unremarkable.  Impression and Recommendations:    Lumbar degenerative disc disease Barnetta Chapel unfortunately has a fairly large L5-S1 disc protrusion causing moderate central canal stenosis. She has done very well so far with home rehab exercises, chiropractic  manipulation and a couple of epidurals, radicular symptoms have resolved. Her pain is over the proximal sacrum but not reproducible with palpation suggesting that this is not a sacral stress injury, SI joint dysfunction, or coccydynia. She continues to have axial pain that I think is related to her disc protrusion. We are going to do 6 weeks of aggressive formal physical therapy before considering surgical intervention, we also are going to try a nonpharmacologic approach. Return to see me 6 weeks after physical therapy and we will come up with another plan.   ___________________________________________ Gwen Her. Dianah Field, M.D., ABFM., CAQSM. Primary Care and Sports Medicine [facility]  Adjunct Professor of [place] [employer] [facility] [employer]

## 2019-10-30 ENCOUNTER — Other Ambulatory Visit: Payer: Self-pay | Admitting: Family Medicine

## 2019-10-31 ENCOUNTER — Other Ambulatory Visit: Payer: Self-pay

## 2019-10-31 ENCOUNTER — Ambulatory Visit: Payer: BC Managed Care – PPO | Admitting: Rehabilitative and Restorative Service Providers"

## 2019-10-31 DIAGNOSIS — R293 Abnormal posture: Secondary | ICD-10-CM | POA: Diagnosis not present

## 2019-10-31 DIAGNOSIS — M545 Low back pain, unspecified: Secondary | ICD-10-CM

## 2019-10-31 NOTE — Therapy (Signed)
[place], [place], [address] Phone: [phone]   Fax:  [phone]  Physical Therapy Treatment  Patient Details  Name: Lorraine Ayers MRN: [id number] Date of Birth: 1971-10-09 Referring Provider (PT): Silverio Decamp, MD   Encounter Date: 10/31/2019  PT End of Session - 10/31/19 1145    Visit Number  1    Number of Visits  12    Date for PT Re-Evaluation  12/15/19    PT Start Time  1100    PT Stop Time  1145    PT Time Calculation (min)  45 min    Activity Tolerance  Patient tolerated treatment well    Behavior During Therapy  [facility] for tasks assessed/performed       No past medical history on file.  Past Surgical History:  Procedure Laterality Date  . CESAREAN SECTION  5-93  . KNEE SURGERY  2008   acl REPAIR ON LEFT KNEE.   . TUBAL LIGATION  12-98    There were no vitals filed for this visit.  Subjective Assessment - 10/31/19 1059    Subjective  Low back pain began in February with radiating pain and difficulty standing upright.  Pain has improved and she currently has point specific sacral pain, pain with walking, pain upon waking, and pain with driving.  She is able to sleep and is moving better since onset, but continues with a constant sensation of pain.  Severity:  5/10 constant up to 9/10 "when it locks up."  Irritability: when sitting long periods and when waking up it is worse until taking alleve, ice also helps.  Nature: constant achiness, with sharp pain when it locks up, Stage:  Subacute    Pertinent History  HTN, h/o back pain    Diagnostic tests  IMPRESSION:1. Central disc protrusion at L5-S1, impinging upon the descendingS1 nerve roots bilaterally in the lateral recesses.2. Otherwise normal MRI of the lumbar spine.    Patient Stated Goals  Try to get rid of pain.  Return to exercising.    Currently in Pain?  Yes    Pain Score  5     Pain Location  Back    Pain  Orientation  Lower    Pain Descriptors / Indicators  Aching    Pain Type  Chronic pain    Pain Onset  More than a month ago    Pain Frequency  Constant    Aggravating Factors   sitting for long periods    Pain Relieving Factors  ice, alleve    Effect of Pain on Daily Activities  limiting activity         [facility] PT Assessment - 10/31/19 1113      Assessment   Medical Diagnosis  M51.36 (ICD-10-CM) - Lumbar degenerative disc disease    Referring Provider (PT)  Silverio Decamp, MD    Next MD Visit  6 weeks    Prior Therapy  none, has seen chiropractor      Precautions   Precautions  None      Restrictions   Weight Bearing Restrictions  No      Balance Screen   Has the patient fallen in the past 6 months  No    Has the patient had a decrease in activity level because of a fear of falling?   No    Is the patient reluctant to leave their home because of a fear of falling?   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      Posture/Postural Control   Posture/Postural Control  Postural limitations    Postural Limitations  Decreased lumbar lordosis      ROM / Strength   AROM / PROM / Strength  AROM;Strength      AROM   Overall AROM   Deficits    Overall AROM Comments  Flexion feels good    AROM Assessment Site  Lumbar    Lumbar Flexion  50% limited    tight hamstrings/ bends knees   Lumbar Extension  WFLs    Lumbar - Right Side Bend  WFLs    Lumbar - Left Side Bend  WFLs    Lumbar - Right Rotation  WFLs    Lumbar - Left Rotation  WFLs      Strength   Overall Strength  Within functional limits for tasks  performed    Strength Assessment Site  Hip;Knee;Ankle    Right/Left Hip  Right;Left    Right Hip Flexion  5/5    Left Hip Flexion  5/5    Right/Left Knee  Right;Left    Right Knee Flexion  5/5    Right Knee Extension  5/5    Left Knee Flexion  5/5    Left Knee Extension  5/5    Right/Left Ankle  Right;Left    Right Ankle Dorsiflexion  5/5    Right Ankle Plantar Flexion  5/5    Left Ankle Dorsiflexion  5/5    Left Ankle Plantar Flexion  5/5      Special Tests    Special Tests  Hip Special Tests;Sacrolliac Tests    Sacroiliac Tests   Pelvic Compression    Hip Special Tests   Saralyn Pilar (FABER) Test;Hip Scouring      Pelvic Compression   Findings  Negative    comment  Compression reduces pain.        Saralyn Pilar [facility]) Test   Findings  Negative    Side  Left;Right      Hip Scouring   Findings  Negative    Side  Left;Right                   [facility] Adult PT Treatment/Exercise - 10/31/19 1113      Exercises   Exercises  Lumbar;Knee/Hip      Lumbar Exercises: Stretches   Active Hamstring Stretch  2 reps;30 seconds;Right;Left    Hip Flexor Stretch  2 reps;30 seconds    Prone on Elbows Stretch  1 rep;60 seconds    Press Ups  5 reps;10 seconds    Quadruped Mid Back Stretch  3 reps;20 seconds    Piriformis Stretch  Right;Left;2 reps;30 seconds    Other Lumbar Stretch Exercise  quadratus lumborum stretch      Lumbar Exercises: Quadruped   Madcat/Old Horse  10 reps             PT Education - 10/31/19 1145    Education Details  HEP    Person(s) Educated  Patient  Methods  Explanation;Demonstration;Handout    Comprehension  Verbalized understanding;Returned demonstration       PT Short Term Goals - 10/31/19 1219      PT SHORT TERM GOAL #1   Title  The patient will be indep with HEP.    Time  4    Period  Weeks    Target Date  11/30/19      PT SHORT TERM GOAL #2   Title  The patient will verbalize understanding of work station set up for improved  mechanics.    Time  4    Period  Weeks    Target Date  11/30/19        PT Long Term Goals - 10/31/19 1220      PT LONG TERM GOAL #1   Title  The patient will return demo HEP progression.    Time  6    Period  Weeks    Target Date  12/15/19      PT LONG TERM GOAL #2   Title  The patient will reduce limitation to < or equal to 30% per FOTO.    Time  6    Period  Weeks    Target Date  12/15/19      PT LONG TERM GOAL #3   Title  The patient will improve forward flexion lumbar spine to reach floor.    Time  6    Period  Weeks    Target Date  12/15/19      PT LONG TERM GOAL #4   Title  The patient will reduce resting pain to < or equal to 2/10.    Time  6    Period  Weeks    Target Date  12/15/19      PT LONG TERM GOAL #5   Title  The patient will return to regular wellness program for long term management of low back pain.    Time  6    Period  Weeks    Target Date  12/15/19            Plan - 10/31/19 1225    Clinical Impression Statement  The patient is a 49 year old female presenting to [place] therapy with low back pain that began in February 2020.  She presents with significant tightness bilateral hamstrings, bilateral hip flexors, decreased joint mobility in lumbar spine.  She responds well to exercise and felt reduction of pain to 0/10 at end of session today. PT to address deficits to improve functional mobility and return to prior activity level.    Examination-Activity Limitations  Locomotion Level;Bend;Lift    Examination-Participation Restrictions  Community Activity;Driving    Stability/Clinical Decision Making  Stable/Uncomplicated    Clinical Decision Making  Low    Rehab Potential  Good    PT Frequency  2x / week    PT Duration  6 weeks    PT Treatment/Interventions  ADLs/Self Care Home Management;Gait training;Functional mobility training;Therapeutic activities;Therapeutic exercise;Cryotherapy;Electrical  Stimulation;Ultrasound;Traction;Neuromuscular re-education;Patient/family education;Spinal Manipulations;Manual techniques;Joint Manipulations    PT Next Visit Plan  progress HEP, add piriformis stretch, lumbar stabilization, lumbar rocking, begin walking program, gastroc stretch.    Consulted and Agree with Plan of Care  Patient       Patient will benefit from skilled therapeutic intervention in order to improve the following deficits and impairments:  Pain, Hypomobility, Postural dysfunction, Impaired flexibility  Visit Diagnosis: Bilateral low back pain without sciatica, unspecified chronicity  Abnormal posture  Problem List Patient Active Problem List   Diagnosis Date Noted  . Lumbar degenerative disc disease 10/24/2019  . Essential hypertension 01/05/2017  . Migraine without aura and without status migrainosus, not intractable 01/05/2017  . Menorrhagia 07/05/2016  . MOOD DISORDER 07/28/2010  . WEIGHT LOSS 07/28/2010    Altamese Deguire, PT 10/31/2019, 12:30 PM  [facility] [place], [place], [address] Phone: [phone]   Fax:  [phone]  Name: Lorraine Ayers MRN: [id number] Date of Birth: 1971/01/12

## 2019-10-31 NOTE — Patient Instructions (Signed)
Access Code: [email or web address]  URL: [email or web address]  Date: 10/31/2019  Prepared by: Rudell Cobb   Exercises Seated Hamstring Stretch - 2-3 reps - 1 sets - 30 seconds hold - 2x daily - 7x weekly Hip Flexor Stretch at Edge of Bed - 2-3 reps - 1 sets - 30 seconds hold - 2x daily - 7x weekly sidelying low back stretch - 2 reps - 1 sets - 30 hold - 2x daily - 7x weekly Cat Cow - 10 reps - 1 sets - 2x daily - 7x weekly Quadruped Rock Back into [employer] Up - 10 reps - 1 sets - 5 seconds hold - 2x daily - 7x weekly

## 2019-11-02 ENCOUNTER — Encounter: Payer: Self-pay | Admitting: Physical Therapy

## 2019-11-02 ENCOUNTER — Other Ambulatory Visit: Payer: Self-pay

## 2019-11-02 ENCOUNTER — Ambulatory Visit: Payer: BC Managed Care – PPO | Admitting: Physical Therapy

## 2019-11-02 DIAGNOSIS — M545 Low back pain, unspecified: Secondary | ICD-10-CM

## 2019-11-02 DIAGNOSIS — R293 Abnormal posture: Secondary | ICD-10-CM | POA: Diagnosis not present

## 2019-11-02 NOTE — Therapy (Signed)
[place] [place], [place], [address] Phone: [phone]   Fax:  [phone]  Physical Therapy Treatment  Patient Details  Name: Lorraine Ayers MRN: [id number] Date of Birth: 1971-01-28 Referring Provider (PT): Silverio Decamp, MD   Encounter Date: 11/02/2019  PT End of Session - 11/02/19 1401    Visit Number  2    Number of Visits  12    Date for PT Re-Evaluation  12/15/19    PT Start Time  1401    PT Stop Time  1440    PT Time Calculation (min)  39 min    Activity Tolerance  Patient tolerated treatment well    Behavior During Therapy  [facility] for tasks assessed/performed       History reviewed. No pertinent past medical history.  Past Surgical History:  Procedure Laterality Date  . CESAREAN SECTION  5-93  . KNEE SURGERY  2008   acl REPAIR ON LEFT KNEE.   . TUBAL LIGATION  12-98    There were no vitals filed for this visit.  Subjective Assessment - 11/02/19 1404    Subjective  Pt states the stretches have given her some relief and have made it easier to get out of bed in the morning.    Currently in Pain?  Yes    Pain Score  3     Pain Location  Back    Pain Orientation  Lower    Pain Descriptors / Indicators  Discomfort    Pain Type  Chronic pain         [facility] PT Assessment - 11/02/19 0001      Assessment   Medical Diagnosis  M51.36 (ICD-10-CM) - Lumbar degenerative disc disease    Referring Provider (PT)  Silverio Decamp, MD    Next MD Visit  6 weeks    Prior Therapy  none, has seen chiropractor       [facility] Adult PT Treatment/Exercise - 11/02/19 0001      Self-Care   Self-Care  Other Self-Care Comments    Other Self-Care Comments   Pt educated on self massage to lower back, hamstrings, and gluteals with ball and rolling pin; pt verbalized understanding and returned demo. Discussed using lumbar support in office and in car with pt to limit back pain and stiffness.       Lumbar Exercises: Stretches   Active Hamstring Stretch  Right;Left;2 reps;30 seconds   seated   Hip Flexor Stretch  2 reps;30 seconds, Rt   thomas stretch   Prone on Elbows Stretch  5 reps;10 seconds    Press Ups  5 reps;5 seconds   Prone, limited motion; switched to POE   Piriformis Stretch  Right;Left;2 reps;30 seconds   supine   Gastroc Stretch  Right;Left;2 reps;20 seconds   runners stretch     Lumbar Exercises: Aerobic   Nustep  L5 x 5 min (arms/legs)      Lumbar Exercises: Supine   Ab Set  5 reps;5 seconds    Pelvic Tilt  5 reps    Hip flexion with ab set   5 reps   Rt/Lt LE, with biofeedback bladder under low back for feedback     Lumbar Exercises: Quadruped   Madcat/Old Horse  10 reps               PT Short Term Goals - 10/31/19 1219      PT SHORT TERM GOAL #1   Title  The patient will be indep with HEP.    Time  4    Period  Weeks    Target Date  11/30/19      PT SHORT TERM GOAL #2   Title  The patient will verbalize understanding of work station set up for improved mechanics.    Time  4    Period  Weeks    Target Date  11/30/19        PT Long Term Goals - 10/31/19 1220      PT LONG TERM GOAL #1   Title  The patient will return demo HEP progression.    Time  6    Period  Weeks    Target Date  12/15/19      PT LONG TERM GOAL #2   Title  The patient will reduce limitation to < or equal to 30% per FOTO.    Time  6    Period  Weeks    Target Date  12/15/19      PT LONG TERM GOAL #3   Title  The patient will improve forward flexion lumbar spine to reach floor.    Time  6    Period  Weeks    Target Date  12/15/19      PT LONG TERM GOAL #4   Title  The patient will reduce resting pain to < or equal to 2/10.    Time  6    Period  Weeks    Target Date  12/15/19      PT LONG TERM GOAL #5   Title  The patient will return to regular wellness program for long term management of low back pain.    Time  6    Period  Weeks    Target Date   12/15/19            Plan - 11/02/19 1557    Clinical Impression Statement  Pt reported reduction in pain with exercises at the end of treatment session. Pt reports less difficulty in getting up in the morning since beginning therapy; making progress toward LTG #4.  Will continue to benefit from skilled PT intervention for improved functional mobility.    Examination-Activity Limitations  Locomotion Level;Bend;Lift    Examination-Participation Restrictions  Community Activity;Driving    Stability/Clinical Decision Making  Stable/Uncomplicated    Rehab Potential  Good    PT Frequency  2x / week    PT Duration  6 weeks    PT Treatment/Interventions  ADLs/Self Care Home Management;Gait training;Functional mobility training;Therapeutic activities;Therapeutic exercise;Cryotherapy;Electrical Stimulation;Ultrasound;Traction;Neuromuscular re-education;Patient/family education;Spinal Manipulations;Manual techniques;Joint Manipulations    PT Next Visit Plan  Continue with stretches, core stabilization, modalities prn    Consulted and Agree with Plan of Care  Patient       Patient will benefit from skilled therapeutic intervention in order to improve the following deficits and impairments:  Pain, Hypomobility, Postural dysfunction, Impaired flexibility  Visit Diagnosis: Bilateral low back pain without sciatica, unspecified chronicity  Abnormal posture     Problem List Patient Active Problem List   Diagnosis Date Noted  . Lumbar degenerative disc disease 10/24/2019  . Essential hypertension 01/05/2017  . Migraine without aura and without status migrainosus, not intractable 01/05/2017  . Menorrhagia 07/05/2016  . MOOD DISORDER 07/28/2010  . WEIGHT LOSS 07/28/2010    Gardiner Rhyme, SPTA 11/02/2019, 4:05 PM   Kerin Perna, PTA 11/02/19 4:39 PM   [place],  [place], [address] Phone:  [phone]   Fax:  [phone]  Name: Lorraine Ayers MRN: [id number] Date of Birth: 05-Jan-1971

## 2019-11-05 ENCOUNTER — Encounter: Payer: BC Managed Care – PPO | Admitting: Physical Therapy

## 2019-11-12 ENCOUNTER — Encounter: Payer: BC Managed Care – PPO | Admitting: Rehabilitative and Restorative Service Providers"

## 2019-11-14 ENCOUNTER — Encounter: Payer: BC Managed Care – PPO | Admitting: Physical Therapy

## 2019-11-15 ENCOUNTER — Encounter: Payer: BC Managed Care – PPO | Admitting: Physical Therapy

## 2019-11-19 ENCOUNTER — Encounter: Payer: BC Managed Care – PPO | Admitting: Rehabilitative and Restorative Service Providers"

## 2019-11-21 ENCOUNTER — Encounter: Payer: BC Managed Care – PPO | Admitting: Rehabilitative and Restorative Service Providers"

## 2019-12-05 ENCOUNTER — Ambulatory Visit: Payer: BC Managed Care – PPO | Admitting: Sports Medicine

## 2019-12-09 ENCOUNTER — Encounter: Payer: Self-pay | Admitting: Rehabilitative and Restorative Service Providers"

## 2019-12-09 NOTE — Therapy (Signed)
[place], [place], [address] Phone: [phone]   Fax:  [phone]  Patient Details  Name: Lorraine Ayers MRN: [id number] Date of Birth: Oct 14, 1971 Referring Provider:  Dr. 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   Remaining deficits: See eval due to low # of visits.   Education / Equipment: HEP.  Plan: Patient agrees to discharge.  Patient goals were not met. Patient is being discharged due to not returning since the last visit.  ?????     Thank you for the referral of this patient. Rudell Cobb, MPT   [name] 12/09/2019, 9:09 PM  [facility] [place], [place], [address] Phone: [phone]   Fax:  [phone]

## 2019-12-21 ENCOUNTER — Other Ambulatory Visit: Payer: Self-pay | Admitting: Family Medicine

## 2019-12-21 DIAGNOSIS — R635 Abnormal weight gain: Secondary | ICD-10-CM

## 2019-12-21 DIAGNOSIS — I10 Essential (primary) hypertension: Secondary | ICD-10-CM

## 2019-12-21 NOTE — Telephone Encounter (Signed)
Needs appointment

## 2020-01-23 ENCOUNTER — Encounter: Payer: Self-pay | Admitting: Family Medicine

## 2020-01-23 ENCOUNTER — Telehealth (INDEPENDENT_AMBULATORY_CARE_PROVIDER_SITE_OTHER): Payer: BC Managed Care – PPO | Admitting: Family Medicine

## 2020-01-23 VITALS — Ht 62.0 in | Wt 164.0 lb

## 2020-01-23 DIAGNOSIS — F488 Other specified nonpsychotic mental disorders: Secondary | ICD-10-CM

## 2020-01-23 DIAGNOSIS — R5383 Other fatigue: Secondary | ICD-10-CM | POA: Diagnosis not present

## 2020-01-23 DIAGNOSIS — M791 Myalgia, unspecified site: Secondary | ICD-10-CM | POA: Diagnosis not present

## 2020-01-23 DIAGNOSIS — E611 Iron deficiency: Secondary | ICD-10-CM | POA: Diagnosis not present

## 2020-01-23 DIAGNOSIS — M7989 Other specified soft tissue disorders: Secondary | ICD-10-CM

## 2020-01-23 MED ORDER — FUSION PLUS PO CAPS: 1.0000 | ORAL_CAPSULE | Freq: Every day | ORAL | 99 refills | Status: DC

## 2020-01-23 NOTE — Progress Notes (Signed)
Pt reports that within the last year she has been experiencing more all over joint pain. 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 She said that she has noticed that when she wakes up she is stiff/achcy. She has tried Epsom salt baths, and was taking IBU but stopped this. She wanted to know if there is a supplement that she could take that would help her with this.   Also she has noticed over the past month that she has become more "foggy/forgetful".  She said that she will go to do something and forget what she was going to do.   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

## 2020-01-23 NOTE — Progress Notes (Signed)
Virtual Visit via Video Note  I connected with Lorraine Ayers on 01/23/20 at  9:50 AM EST by a video enabled telemedicine application and verified that I am speaking with the correct person using two identifiers.   I discussed the limitations of evaluation and management by telemedicine and the availability of in person appointments. The patient expressed understanding and agreed to proceed.  Subjective:    CC: More joint aches and brain fogginess.  HPI: Pt reports that within the last year she has been experiencing more all over joint pain. 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 She said that she has noticed that when she wakes up she is stiff/achcy. She has tried Epsom salt baths, and was taking IBU but stopped this. She wanted to know if there is a supplement that she could take that would help her with this. She does 10.000 steps per day. Has notices some swelling in her fingers intermittantly.    Also she has noticed over the past month that she has become more "foggy/forgetful".  She said that she will go to do something and forget what she was going to do. Sleep is OK but getting up twice at night to urinate. Her sleep cycle has change.  For example, the other day she called her assistant principal to come so that they can discuss an issue and by the time they met up a few minutes later, she could not remember what she was going to tell him.  Still having period every 33 days. Emotions are stable. No inc irritability. No highs and lows emotionally.  Currently on citalopram 20 mg and feels like that actually is working well.Marland Kitchen   Hx of iron def anemia - she gets nauseate and had constipation on iron supplement so stopped it.   HTN - eating a low salt diet and avoiding fast foods since Covid hit.  He feels like that has helped with intermittent swelling but she still gets some occasional swelling in the hands.  Past medical history, Surgical history, Family  history not pertinant except as noted below, Social history, Allergies, and medications have been entered into the medical record, reviewed, and corrections made.   Review of Systems: No fevers, chills, night sweats, weight loss, chest pain, or shortness of breath.   Objective:    General: Speaking clearly in complete sentences without any shortness of breath.  Alert and oriented x3.  Normal judgment. No apparent acute distress.    Impression and Recommendations:    Low iron -plan to recheck iron levels.  She has not been on a supplement in a while.  We discussed maybe a trial of fusion which I find tolerated really well though it is prescription may or may not be covered by her current insurance plan.  Fatigue -we will check a CBC and thyroid level.  It does sound like she is also waking up a couple times a night to urinate which could be contributing and we could always consider a trial of an overactive medication if she would like she says she will think about it and let me know.  Bilateral hand swelling -check sed rate and CRP for other autoimmune disorders.  Myalgias/joint pain-will evaluate for thyroid and elevated CK levels.  Recommend a trial of turmeric.    Brain Fog -discussed working on sleep quality.  If symptoms persist or worsen then please let me know.  She feels like it is more related to short-term  Time spent in encounter 22 minutes.  I discussed the assessment and treatment plan with the patient. The patient was provided an opportunity to ask questions and all were answered. The patient agreed with the plan and demonstrated an understanding of the instructions.   The patient was advised to call back or seek an in-person evaluation if the symptoms worsen or if the condition fails to improve as anticipated.   Beatrice Lecher, MD

## 2020-03-16 IMAGING — MR MRI LUMBAR SPINE WITHOUT CONTRAST
4 of 5 series · 26 of 48 positions shown · non-contrast
Comparison: None available.

CLINICAL DATA: Initial evaluation for low back pain for 3 months,
posterior right leg weakness, pain, and numbness.

EXAM:
MRI LUMBAR SPINE WITHOUT CONTRAST
TECHNIQUE: Multiplanar, multisequence MR imaging of the lumbar spine was
performed. No intravenous contrast was administered.

[Series 5: T2 · sagittal · 4.0mm · 0.81mm/px · 6 of 15 slices shown (1 of 2)]
[im 1/15]
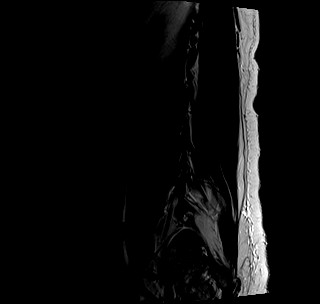
[im 3/15]
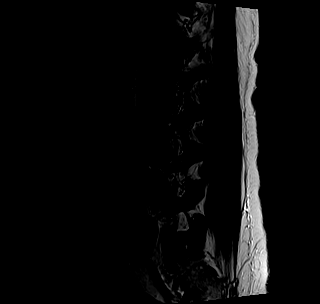
[im 6/15]
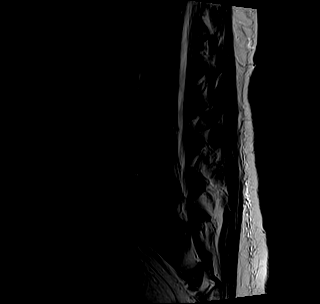
[im 9/15]
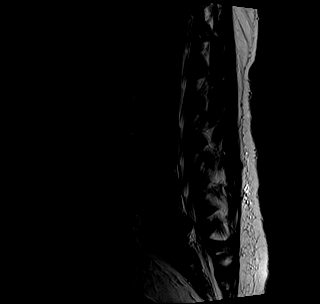
[im 12/15]
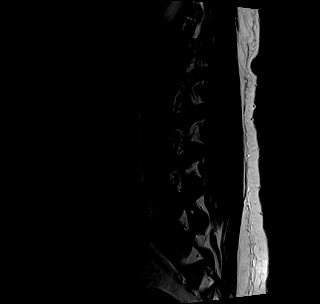
[im 15/15]
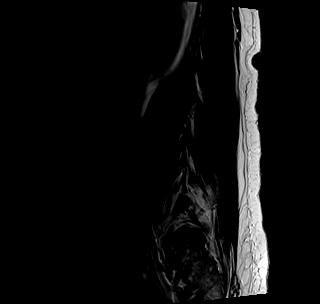

[Series 6: T1 · sagittal · 4.0mm · 0.41mm/px · 6 of 15 slices shown (1 of 2)]
[im 1/15]
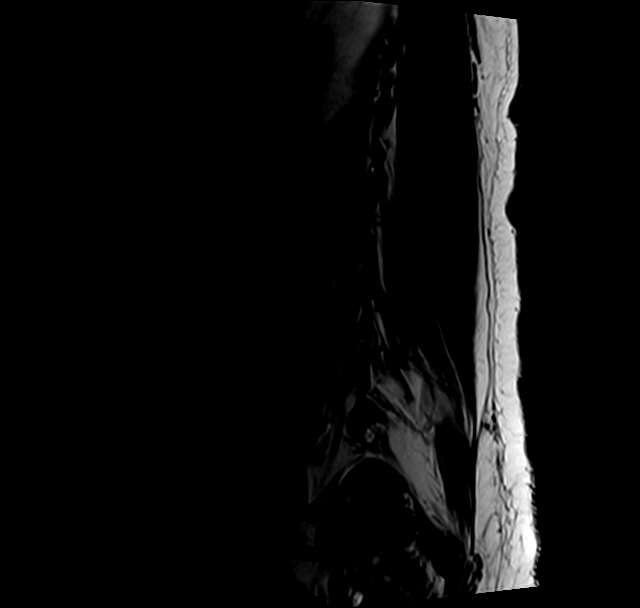
[im 3/15]
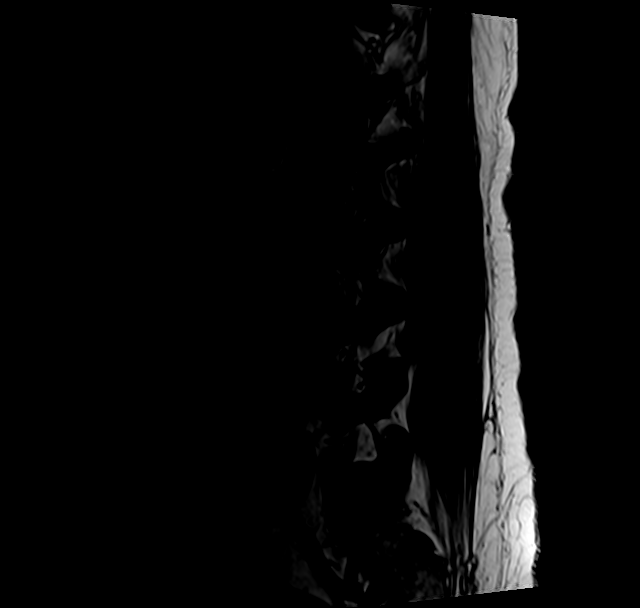
[im 6/15]
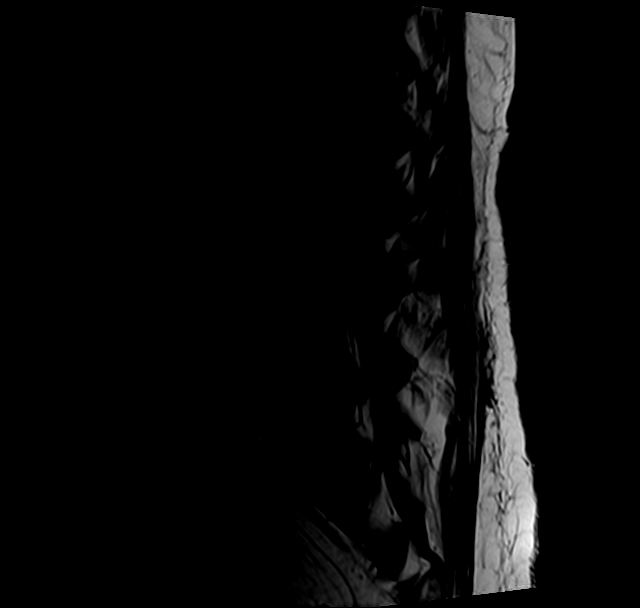
[im 9/15]
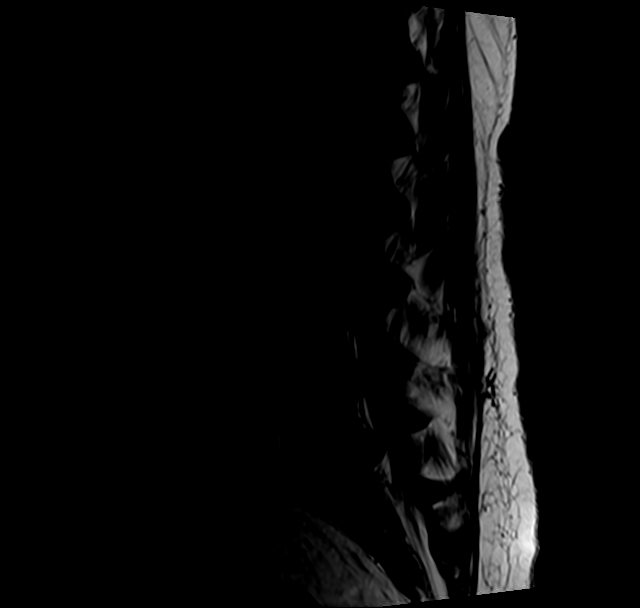
[im 12/15]
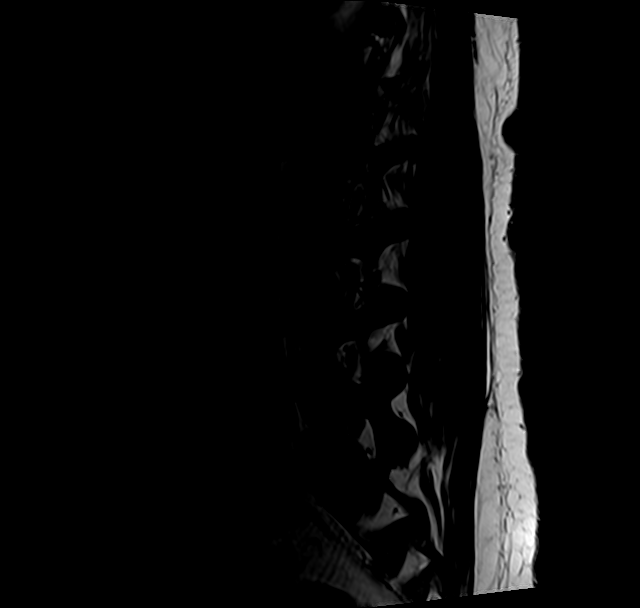
[im 15/15]
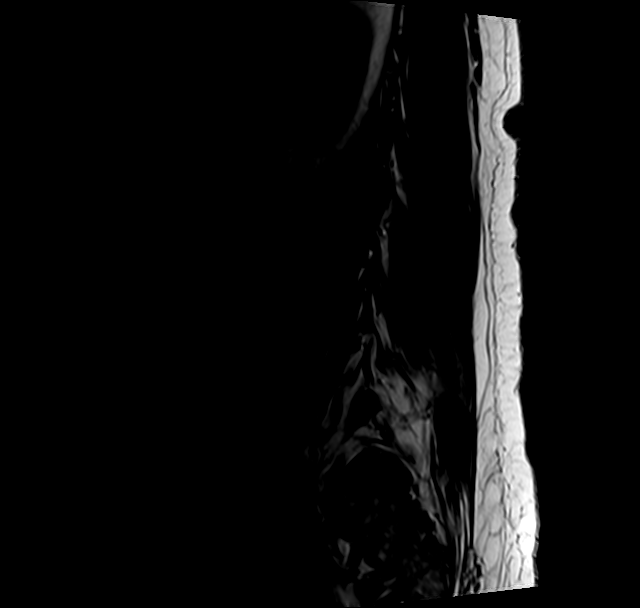

[Series 8: T2 · axial · 4.0mm · 0.78mm/px · z∈[-98,+116]mm · 9 of 39 slices shown (2 of 2)]
[im 1/39]
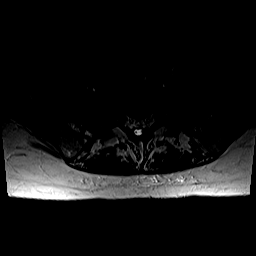
[im 6/39]
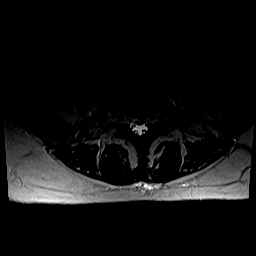
[im 11/39]
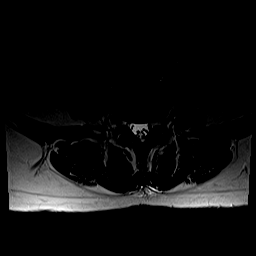
[im 17/39]
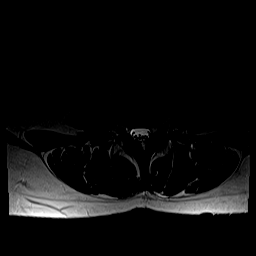
[im 20/39]
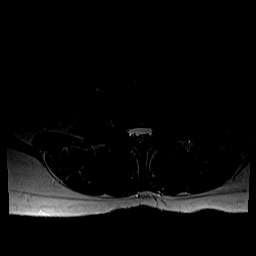
[im 22/39]
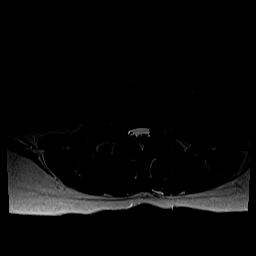
[im 28/39]
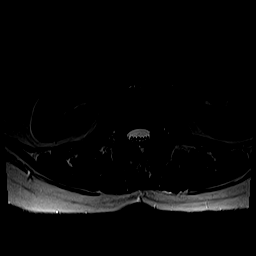
[im 33/39]
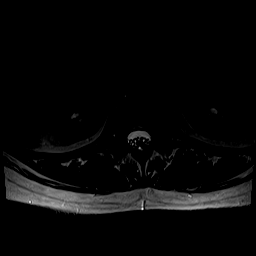
[im 39/39]
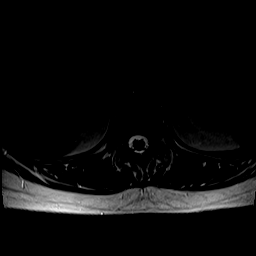

[Series 9: T1 · axial · 4.0mm · 0.39mm/px · z∈[-91,+86]mm · 5 of 39 slices shown (2 of 2)]
[im 1/39]
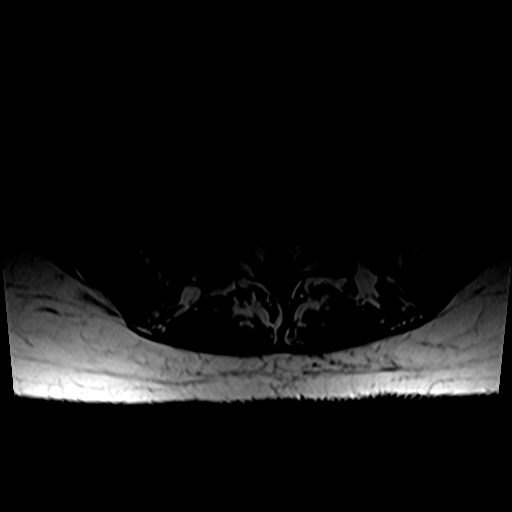
[im 6/39]
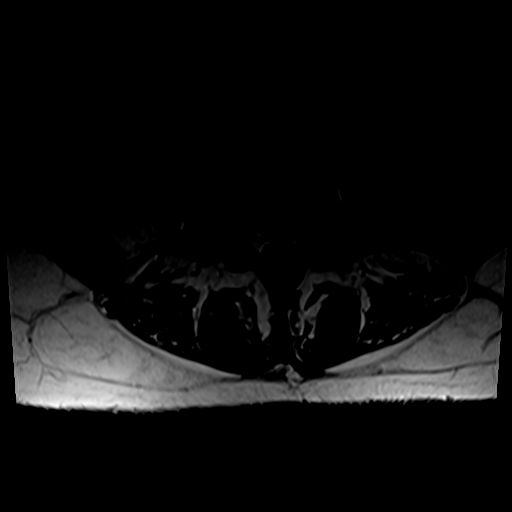
[im 11/39]
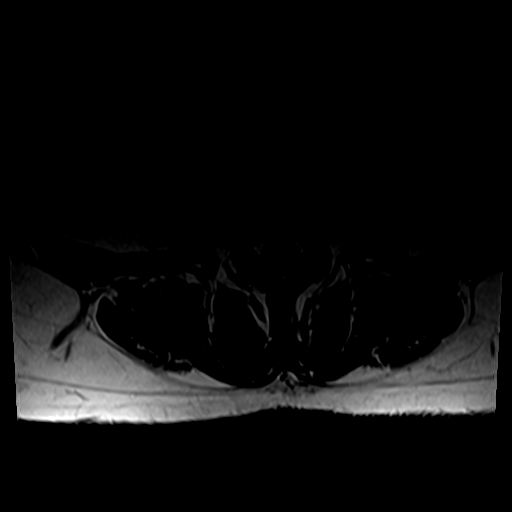
[im 20/39]
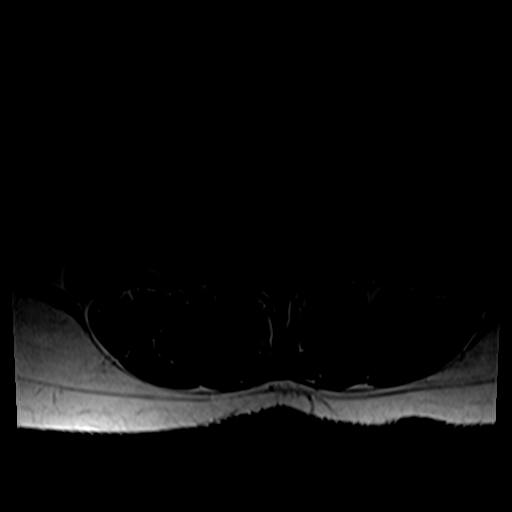
[im 33/39]
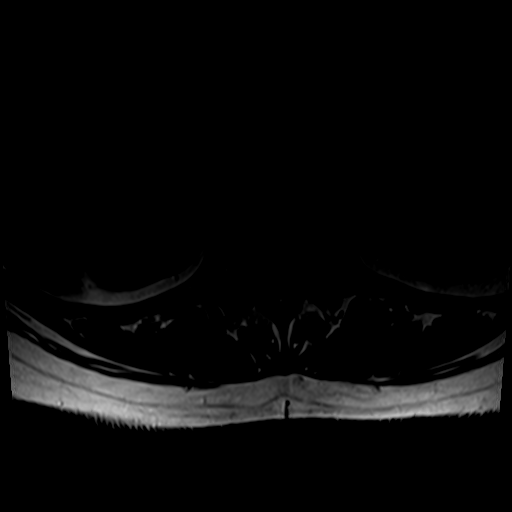

[26 of 48 positions shown; findings below may reference images not displayed]

FINDINGS: Segmentation: Standard. Lowest well-formed disc labeled the L5-S1
level.

Alignment: Mild straightening of the normal lumbar lordosis. No
listhesis.

Vertebrae: Vertebral body heights well maintained without evidence
for acute or chronic fracture. Bone marrow signal intensity within
normal limits. Few scattered subcentimeter benign hemangiomas noted.
No other discrete or worrisome osseous lesions. Reactive endplate
changes present about the L5-S1 interspace. No other abnormal marrow
edema.

Conus medullaris and cauda equina: Conus extends to the L1 level.
Conus and cauda equina appear normal.

Paraspinal and other soft tissues: Paraspinous soft tissues within
normal limits. Visualized visceral structures are normal.

Disc levels:

L1-2:  Unremarkable.

L2-3:  Unremarkable.

L3-4:  Unremarkable.

L4-5: Disc desiccation without significant disc bulge. No stenosis
or impingement.

L5-S1: Disc desiccation with intervertebral disc space narrowing.
Superimposed moderate-sized central disc protrusion contacts and
impinges upon the descending S1 nerve roots bilaterally (series 9,
image 37). Moderate bilateral lateral recess stenosis. Distal thecal
sac remains patent. No foraminal stenosis.
IMPRESSION: 1. Central disc protrusion at L5-S1, impinging upon the descending
S1 nerve roots bilaterally in the lateral recesses.
2. Otherwise normal MRI of the lumbar spine.

## 2020-03-20 ENCOUNTER — Other Ambulatory Visit: Payer: Self-pay | Admitting: Family Medicine

## 2020-03-20 DIAGNOSIS — R635 Abnormal weight gain: Secondary | ICD-10-CM

## 2020-03-20 DIAGNOSIS — I10 Essential (primary) hypertension: Secondary | ICD-10-CM

## 2020-03-30 IMAGING — XA Imaging study
2 series · 2 of 2 positions shown · non-contrast
Comparison: none

CLINICAL DATA: Lumbosacral spondylosis without myelopathy. Low back
and right leg pain. L5-S1 disc protrusion.

[Series 1: ortho standard · 1 of 1 slices shown (1 of 2)]
[im 1/1]
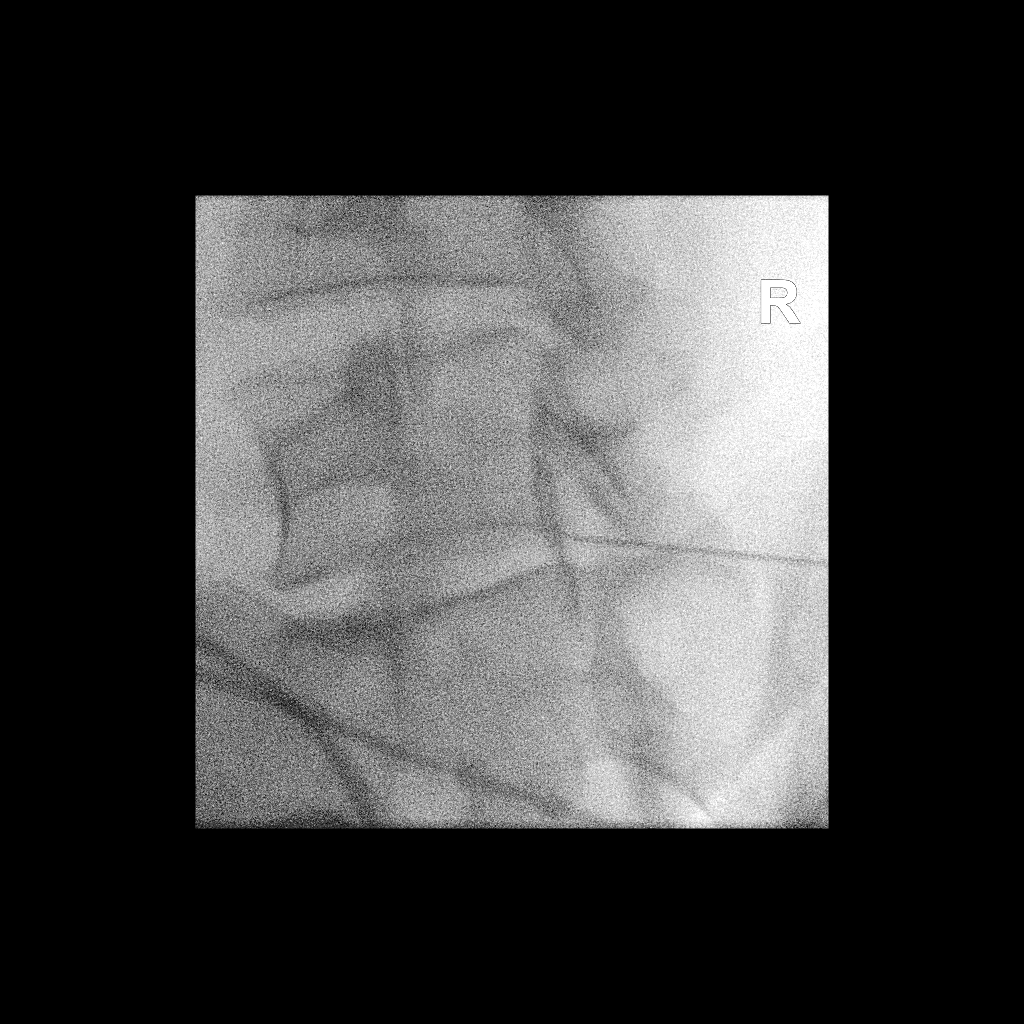

[Series 2: ortho standard · 1 of 1 slices shown (2 of 2)]
[im 1/1]
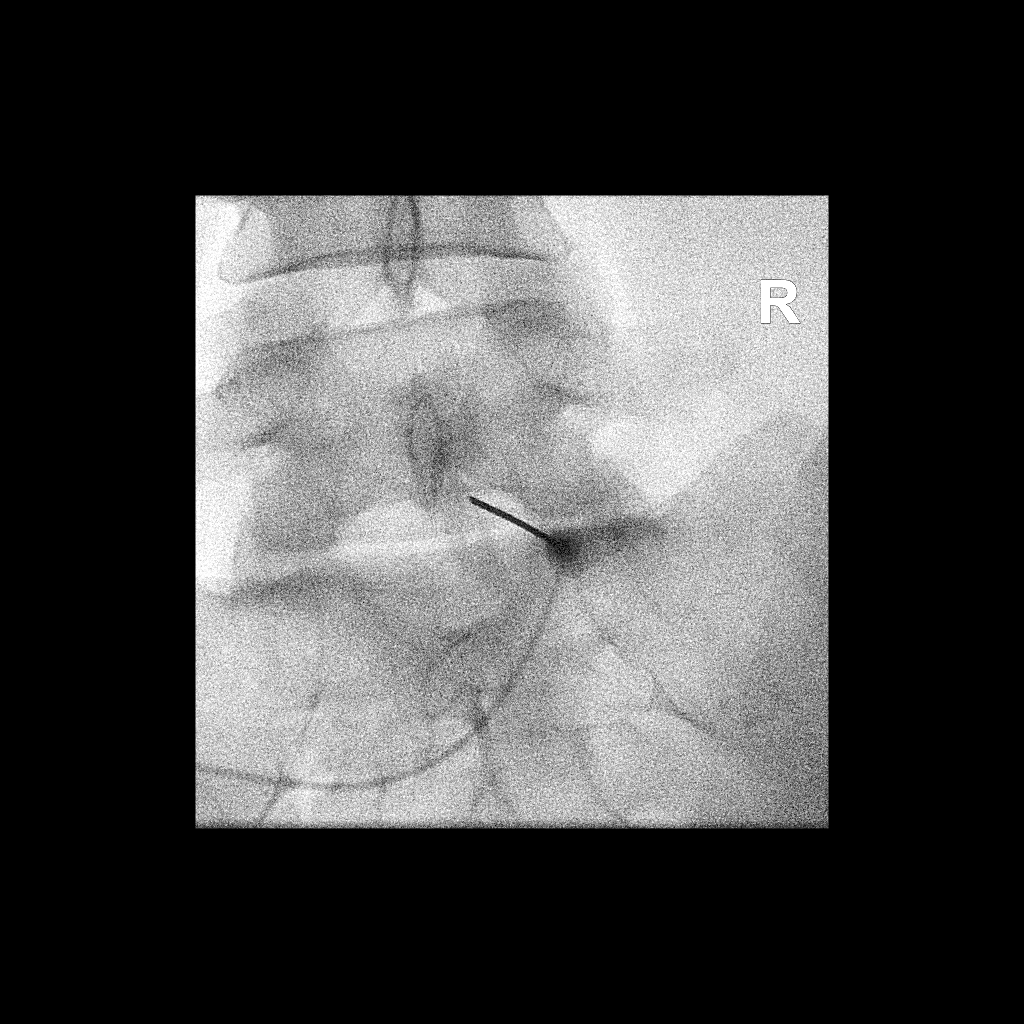

[2 of 2 positions shown; findings below may reference images not displayed]

FLUOROSCOPY TIME:  Fluoroscopy Time: 4 seconds

Radiation Exposure Index: 8.03 microGray*m^2

PROCEDURE:
The procedure, risks, benefits, and alternatives were explained to
the patient. Questions regarding the procedure were encouraged and
answered. The patient understands and consents to the procedure.

LUMBAR EPIDURAL INJECTION:

An interlaminar approach was performed on the right at L5-S1. The
overlying skin was cleansed and anesthetized. A 3.5 inch 20 gauge
epidural needle was advanced using loss-of-resistance technique.

DIAGNOSTIC EPIDURAL INJECTION:

Injection of Isovue-M 200 shows a good epidural pattern with spread
above and below the level of needle placement, primarily on the
right. No vascular opacification is seen.

THERAPEUTIC EPIDURAL INJECTION:

120 mg of Depo-Medrol mixed with 3 mL of 1% lidocaine were
instilled. The procedure was well-tolerated, and the patient was
discharged thirty minutes following the injection in good condition.

COMPLICATIONS:
None
IMPRESSION: Technically successful lumbar interlaminar epidural injection on the
right at L5-S1.

## 2020-04-14 ENCOUNTER — Other Ambulatory Visit: Payer: Self-pay | Admitting: Family Medicine

## 2020-04-14 DIAGNOSIS — I10 Essential (primary) hypertension: Secondary | ICD-10-CM

## 2020-04-14 DIAGNOSIS — R635 Abnormal weight gain: Secondary | ICD-10-CM

## 2020-04-25 ENCOUNTER — Telehealth: Payer: Self-pay | Admitting: Family Medicine

## 2020-04-25 DIAGNOSIS — Z1211 Encounter for screening for malignant neoplasm of colon: Secondary | ICD-10-CM

## 2020-04-25 NOTE — Telephone Encounter (Signed)
Call pt: Due for colon cancer screening since she is 48. See what she would like to do.  Scope, cologuard, etc?

## 2020-04-28 NOTE — Telephone Encounter (Signed)
Sending to assistant  

## 2020-05-03 ENCOUNTER — Other Ambulatory Visit: Payer: Self-pay | Admitting: Family Medicine

## 2020-05-05 NOTE — Telephone Encounter (Signed)
cologuard sent. appt made for f/u.

## 2020-05-09 ENCOUNTER — Other Ambulatory Visit: Payer: Self-pay | Admitting: Family Medicine

## 2020-05-09 DIAGNOSIS — R635 Abnormal weight gain: Secondary | ICD-10-CM

## 2020-05-09 DIAGNOSIS — I10 Essential (primary) hypertension: Secondary | ICD-10-CM

## 2020-05-13 ENCOUNTER — Ambulatory Visit: Payer: BC Managed Care – PPO | Admitting: Family Medicine

## 2020-05-21 ENCOUNTER — Ambulatory Visit: Payer: BC Managed Care – PPO | Admitting: Family Medicine

## 2020-05-30 ENCOUNTER — Encounter: Payer: Self-pay | Admitting: Family Medicine

## 2020-05-30 ENCOUNTER — Ambulatory Visit (INDEPENDENT_AMBULATORY_CARE_PROVIDER_SITE_OTHER): Payer: BC Managed Care – PPO | Admitting: Family Medicine

## 2020-05-30 ENCOUNTER — Telehealth: Payer: Self-pay | Admitting: Family Medicine

## 2020-05-30 VITALS — BP 141/82 | HR 78 | Ht 62.0 in | Wt 175.0 lb

## 2020-05-30 DIAGNOSIS — R5383 Other fatigue: Secondary | ICD-10-CM | POA: Diagnosis not present

## 2020-05-30 DIAGNOSIS — I10 Essential (primary) hypertension: Secondary | ICD-10-CM | POA: Diagnosis not present

## 2020-05-30 DIAGNOSIS — E611 Iron deficiency: Secondary | ICD-10-CM | POA: Diagnosis not present

## 2020-05-30 DIAGNOSIS — R635 Abnormal weight gain: Secondary | ICD-10-CM | POA: Diagnosis not present

## 2020-05-30 DIAGNOSIS — M5136 Other intervertebral disc degeneration, lumbar region: Secondary | ICD-10-CM

## 2020-05-30 MED ORDER — HYDROCHLOROTHIAZIDE 25 MG PO TABS: 25.0000 mg | ORAL_TABLET | Freq: Every day | ORAL | 1 refills | Status: DC

## 2020-05-30 MED ORDER — LISINOPRIL-HYDROCHLOROTHIAZIDE 20-25 MG PO TABS: 1.0000 | ORAL_TABLET | Freq: Every day | ORAL | 1 refills | Status: DC

## 2020-05-30 NOTE — Telephone Encounter (Signed)
Please call pt: since her BP wasn't quite at goal, I am changin her to a combo pill. Still once a day and still generic.  Try to check BP at home next couple of week and see if BP less than 130 on the top. If less than 110 then too low

## 2020-05-30 NOTE — Progress Notes (Signed)
Established Patient Office Visit  Subjective:  Patient ID: Lorraine Ayers, female    DOB: [birth year]  Age: 49 y.o. MRN: [id number]  CC:  Chief Complaint  Patient presents with  . Hypertension    HPI Lorraine Ayers presents for   Hypertension- Pt denies chest pain, SOB, dizziness, or heart palpitations.  Taking meds as directed w/o problems.  Denies medication side effects.    Still very fatigued.  Comes home on [place] and goes to bed.  Feels exhausted. hasn't had a chance to go to the labs. 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  + inc appetite, bit sometimes skips lunch.    Hx of Lumbar DDD.  Injured her back again last week doing some lifting. No sciatica this time.  Say sittng makes ti works. Does commute 1.5 hours for work.  Using Aleve and stretches. Doesn't like muscle relaxers.   History reviewed. No pertinent past medical history.  Past Surgical History:  Procedure Laterality Date  . CESAREAN SECTION  5-93  . KNEE SURGERY  2008   acl REPAIR ON LEFT KNEE.   . TUBAL LIGATION  12-98    Family History  Problem Relation Age of Onset  . Heart attack Father   . Hyperlipidemia Father   . Hypertension Father   . Alcohol abuse Other   . Dementia Mother   . Uterine cancer Mother     Social History   Socioeconomic History  . Marital status: Married    Spouse name: Not on file  . Number of children: Not on file  . Years of education: Not on file  . Highest education level: Not on file  Occupational History  . Not on file  Tobacco Use  . Smoking status: Never Smoker  . Smokeless tobacco: Never Used  Substance and Sexual Activity  . Alcohol use: Yes    Alcohol/week: 1.0 standard drink    Types: 1 Cans of beer per week  . Drug use: No  . Sexual activity: Yes  Other Topics Concern  . Not on file  Social History Narrative  . Not on file   Social Determinants of Health   Financial Resource Strain:   . Difficulty of  Paying Living Expenses:   Food Insecurity:   . Worried About Lorraine Ayers in the Last Year:   . Arboriculturist in the Last Year:   Transportation Needs:   . Film/video editor (Medical):   Marland Kitchen Lack of Transportation (Non-Medical):   Physical Activity:   . Days of Exercise per Week:   . Minutes of Exercise per Session:   Stress:   . Feeling of Stress :   Social Connections:   . Frequency of Communication with Friends and Family:   . Frequency of Social Gatherings with Friends and Family:   . Attends Religious Services:   . Active Member of Clubs or Organizations:   . Attends Archivist Meetings:   Marland Kitchen Marital Status:   Intimate Partner Violence:   . Fear of Current or Ex-Partner:   . Emotionally Abused:   Marland Kitchen Physically Abused:   . Sexually Abused:     Outpatient Medications Prior to Visit  Medication Sig Dispense Refill  . citalopram (CELEXA) 20 MG tablet TAKE 1 TABLET BY MOUTH EVERY DAY 90 tablet 1  . Iron-FA-B Cmp-C-Biot-Probiotic (FUSION PLUS) CAPS Take 1 capsule by mouth daily. 30 capsule PRN  . hydrochlorothiazide (HYDRODIURIL) 25  MG tablet TAKE 1 TABLET (25 MG TOTAL) BY MOUTH DAILY. [place].MUST SCHEDULE AN APPOINTMENT AND HAVE LAB WORK DONE FOR REFILLS. 30 tablet 0   No facility-administered medications prior to visit.    No Known Allergies  ROS Review of Systems    Objective:    Physical Exam Constitutional:      Appearance: She is well-developed.  HENT:     Head: Normocephalic and atraumatic.  Cardiovascular:     Rate and Rhythm: Normal rate and regular rhythm.     Heart sounds: Normal heart sounds.  Pulmonary:     Effort: Pulmonary effort is normal.     Breath sounds: Normal breath sounds.  Skin:    General: Skin is warm and dry.  Neurological:     Mental Status: She is alert and oriented to person, place, and time.  Psychiatric:        Behavior: Behavior normal.     BP (!) 141/82   Pulse 78   Ht 5\' 2"  (1.575 m)    Wt 175 lb (79.4 kg)   SpO2 100%   BMI 32.01 kg/m  Wt Readings from Last 3 Encounters:  05/30/20 175 lb (79.4 kg)  01/23/20 164 lb (74.4 kg)  10/24/19 169 lb (76.7 kg)     Health Maintenance Due  Topic Date Due  . Hepatitis C Screening  Never done  . HIV Screening  Never done  . COLONOSCOPY  Never done    There are no preventive care reminders to display for this patient.  Lab Results  Component Value Date   TSH 1.72 05/30/2020   Lab Results  Component Value Date   WBC 7.3 05/30/2020   HGB 11.3 (L) 05/30/2020   HCT 35.9 05/30/2020   MCV 72.8 (L) 05/30/2020   PLT 316 05/30/2020   Lab Results  Component Value Date   NA 137 05/30/2020   K 4.3 05/30/2020   CO2 27 05/30/2020   GLUCOSE 87 05/30/2020   BUN 18 05/30/2020   CREATININE 0.80 05/30/2020   BILITOT 0.5 05/30/2020   ALKPHOS 71 06/18/2016   AST 15 05/30/2020   ALT 14 05/30/2020   PROT 7.4 05/30/2020   ALBUMIN 4.0 06/18/2016   CALCIUM 9.6 05/30/2020   Lab Results  Component Value Date   CHOL 208 (H) 06/28/2017   Lab Results  Component Value Date   HDL 65 06/28/2017   No results found for: [facility] Lab Results  Component Value Date   TRIG 141 06/28/2017   Lab Results  Component Value Date   CHOLHDL 3.2 06/28/2017   No results found for: HGBA1C    Assessment & Plan:   Problem List Items Addressed This Visit      Cardiovascular and Mediastinum   Essential hypertension    BP not at goal. Add lisinopril to the HCTZ.  Monitor BP at home. Will go for labs today. Encourage getting back on track with exercise.       Relevant Medications   lisinopril-hydrochlorothiazide (ZESTORETIC) 20-25 MG tablet     Musculoskeletal and Integument   Lumbar degenerative disc disease    Continue stretches and NSAID.  Can consider referral back to Lorraine Ayers if not improvement or formal PT. Also discussed getting a sit/stand desk at work and a cushion support for her back.         Other   Low iron - Primary    Du  to recheck iron.  Other Visit Diagnoses    Abnormal weight gain       Fatigue, unspecified type          Fatigue - will check labs to rule out potential causes. Hx of low iron.  Labs ordered.  Consider stress as well.   Meds ordered this encounter  Medications  . DISCONTD: hydrochlorothiazide (HYDRODIURIL) 25 MG tablet    Sig: Take 1 tablet (25 mg total) by mouth daily.    Dispense:  90 tablet    Refill:  1  . lisinopril-hydrochlorothiazide (ZESTORETIC) 20-25 MG tablet    Sig: Take 1 tablet by mouth daily.    Dispense:  90 tablet    Refill:  1    This is in place of hctz 25mg     Follow-up: Return in about 6 months (around 11/29/2020) for bp.    Beatrice Lecher, MD

## 2020-05-30 NOTE — Assessment & Plan Note (Addendum)
BP not at goal. Add lisinopril to the HCTZ.  Monitor BP at home. Will go for labs today. Encourage getting back on track with exercise.

## 2020-05-30 NOTE — Assessment & Plan Note (Signed)
Du to recheck iron.

## 2020-05-31 ENCOUNTER — Encounter: Payer: Self-pay | Admitting: Family Medicine

## 2020-05-31 LAB — CBC
HCT: 35.9 % (ref 35.0–45.0)
Hemoglobin: 11.3 g/dL — ABNORMAL LOW (ref 11.7–15.5)
MCH: 22.9 pg — ABNORMAL LOW (ref 27.0–33.0)
MCHC: 31.5 g/dL — ABNORMAL LOW (ref 32.0–36.0)
MCV: 72.8 fL — ABNORMAL LOW (ref 80.0–100.0)
MPV: 11.2 fL (ref 7.5–12.5)
Platelets: 316 10*3/uL (ref 140–400)
RBC: 4.93 10*6/uL (ref 3.80–5.10)
RDW: 14.4 % (ref 11.0–15.0)
WBC: 7.3 10*3/uL (ref 3.8–10.8)

## 2020-05-31 LAB — COMPLETE METABOLIC PANEL WITH GFR
AG Ratio: 1.5 (calc) (ref 1.0–2.5)
ALT: 14 U/L (ref 6–29)
AST: 15 U/L (ref 10–35)
Albumin: 4.4 g/dL (ref 3.6–5.1)
Alkaline phosphatase (APISO): 73 U/L (ref 37–153)
BUN: 18 mg/dL (ref 7–25)
CO2: 27 mmol/L (ref 20–32)
Calcium: 9.6 mg/dL (ref 8.6–10.4)
Chloride: 99 mmol/L (ref 98–110)
Creat: 0.8 mg/dL (ref 0.50–1.05)
GFR, Est African American: 99 mL/min/{1.73_m2} (ref >=60)
GFR, Est Non African American: 85 mL/min/{1.73_m2} (ref >=60)
Globulin: 3 g/dL (calc) (ref 1.9–3.7)
Glucose, Bld: 87 mg/dL (ref 65–99)
Potassium: 4.3 mmol/L (ref 3.5–5.3)
Sodium: 137 mmol/L (ref 135–146)
Total Bilirubin: 0.5 mg/dL (ref 0.2–1.2)
Total Protein: 7.4 g/dL (ref 6.1–8.1)

## 2020-05-31 LAB — SEDIMENTATION RATE: Sed Rate: 28 mm/h (ref 0–30)

## 2020-05-31 LAB — IRON,TIBC AND FERRITIN PANEL
%SAT: 5 % (calc) — ABNORMAL LOW (ref 16–45)
Ferritin: 8 ng/mL — ABNORMAL LOW (ref 16–232)
Iron: 23 ug/dL — ABNORMAL LOW (ref 45–160)
TIBC: 482 mcg/dL (calc) — ABNORMAL HIGH (ref 250–450)

## 2020-05-31 LAB — CK: Total CK: 78 U/L (ref 29–143)

## 2020-05-31 LAB — TSH: TSH: 1.72 mIU/L

## 2020-05-31 NOTE — Assessment & Plan Note (Signed)
Continue stretches and NSAID.  Can consider referral back to Dr. Darene Lamer if not improvement or formal PT. Also discussed getting a sit/stand desk at work and a cushion support for her back.

## 2020-06-02 NOTE — Telephone Encounter (Signed)
LVM advising pt of recommendations advised to rtn call or send a my chart message.

## 2020-06-02 NOTE — Telephone Encounter (Signed)
Patient advised.

## 2020-06-02 NOTE — Telephone Encounter (Signed)
Left a message for patient to return call.

## 2020-09-23 ENCOUNTER — Telehealth (INDEPENDENT_AMBULATORY_CARE_PROVIDER_SITE_OTHER): Payer: BC Managed Care – PPO | Admitting: Family Medicine

## 2020-09-23 ENCOUNTER — Encounter: Payer: Self-pay | Admitting: Family Medicine

## 2020-09-23 DIAGNOSIS — Z91030 Bee allergy status: Secondary | ICD-10-CM | POA: Insufficient documentation

## 2020-09-23 DIAGNOSIS — T63441A Toxic effect of venom of bees, accidental (unintentional), initial encounter: Secondary | ICD-10-CM

## 2020-09-23 MED ORDER — EPINEPHRINE 0.3 MG/0.3ML IJ SOAJ: 0.3000 mg | INTRAMUSCULAR | 0 refills | Status: DC | PRN

## 2020-09-23 NOTE — Progress Notes (Signed)
Lorraine Ayers - 49 y.o. female MRN [id number]  Date of birth: July 03, 1971   This visit type was conducted due to national recommendations for restrictions regarding the COVID-19 Pandemic (e.g. social distancing).  This format is felt to be most appropriate for this patient at this time.  All issues noted in this document were discussed and addressed.  No physical exam was performed (except for noted visual exam findings with Video Visits).  I discussed the limitations of evaluation and management by telemedicine and the availability of in person appointments. The patient expressed understanding and agreed to proceed.  I connected with@ on 09/23/20 at  3:20 PM EDT by a video enabled telemedicine application and verified that I am speaking with the correct person using two identifiers.  Present at visit: Lorraine Nutting, DO Al[facility]   Patient Location: Home [address] [place] [address]   Provider location:   [place]  No chief complaint on file.   HPI  Lorraine Ayers is a 49 y.o. female who presents via audio/video conferencing for a telehealth visit today.  She had bee sting to L lower eyelid last Friday.  Had swelling and some pain and itching.  Took benadryl over the weekend and yesterday.  She has also been applying ice.  This has improved today.  She has not had any prior reactions like this to bee stings.  Not currently affecting her vision.  She denies difficulty breathing, dizziness or lightheaded feeling.      ROS:  A comprehensive ROS was completed and negative except as noted per HPI  History reviewed. No pertinent past medical history.  Past Surgical History:  Procedure Laterality Date  . CESAREAN SECTION  5-93  . KNEE SURGERY  2008   acl REPAIR ON LEFT KNEE.   . TUBAL LIGATION  12-98    Family History  Problem Relation Age of Onset  . Heart attack Father   . Hyperlipidemia Father   . Hypertension Father   . Alcohol abuse Other   . Dementia Mother   .  Uterine cancer Mother     Social History   Socioeconomic History  . Marital status: Married    Spouse name: Not on file  . Number of children: Not on file  . Years of education: Not on file  . Highest education level: Not on file  Occupational History  . Not on file  Tobacco Use  . Smoking status: Never Smoker  . Smokeless tobacco: Never Used  Substance and Sexual Activity  . Alcohol use: Yes    Alcohol/week: 1.0 standard drink    Types: 1 Cans of beer per week  . Drug use: No  . Sexual activity: Yes  Other Topics Concern  . Not on file  Social History Narrative  . Not on file   Social Determinants of Health   Financial Resource Strain:   . Difficulty of Paying Living Expenses: Not on file  Food Insecurity:   . Worried About Charity fundraiser in the Last Year: Not on file  . Ran Out of Food in the Last Year: Not on file  Transportation Needs:   . Lack of Transportation (Medical): Not on file  . Lack of Transportation (Non-Medical): Not on file  Physical Activity:   . Days of Exercise per Week: Not on file  . Minutes of Exercise per Session: Not on file  Stress:   . Feeling of Stress : Not on file  Social Connections:   . Frequency  of Communication with Friends and Family: Not on file  . Frequency of Social Gatherings with Friends and Family: Not on file  . Attends Religious Services: Not on file  . Active Member of Clubs or Organizations: Not on file  . Attends Archivist Meetings: Not on file  . Marital Status: Not on file  Intimate Partner Violence:   . Fear of Current or Ex-Partner: Not on file  . Emotionally Abused: Not on file  . Physically Abused: Not on file  . Sexually Abused: Not on file     Current Outpatient Medications:  .  citalopram (CELEXA) 20 MG tablet, TAKE 1 TABLET BY MOUTH EVERY DAY, Disp: 90 tablet, Rfl: 1 .  EPINEPHrine 0.3 mg/0.3 mL IJ SOAJ injection, Inject 0.3 mg into the muscle as needed for anaphylaxis., Disp: 1 each,  Rfl: 0 .  Iron-FA-B Cmp-C-Biot-Probiotic (FUSION PLUS) CAPS, Take 1 capsule by mouth daily., Disp: 30 capsule, Rfl: PRN .  lisinopril-hydrochlorothiazide (ZESTORETIC) 20-25 MG tablet, Take 1 tablet by mouth daily., Disp: 90 tablet, Rfl: 1  EXAM:  VITALS per patient if applicable: There were no vitals taken for this visit.  GENERAL: alert, oriented, appears well and in no acute distress  HEENT:  Swelling around lower orbit. No conjunctival injection.  EOMI.   NECK: normal movements of the head and neck  LUNGS: on inspection no signs of respiratory distress, breathing rate appears normal, no obvious gross SOB, gasping or wheezing  CV: no obvious cyanosis  MS: moves all visible extremities without noticeable abnormality  PSYCH/NEURO: pleasant and cooperative, no obvious depression or anxiety, speech and thought processing grossly intact  ASSESSMENT AND PLAN:  Discussed the following assessment and plan:  Bee sting Localized reaction Improved since initial onset a few days ago.  She may continue benadryl as needed.  Declines steroids as symptoms are resolving.   She does not recall having reactions like this in the past to bee stings. Will send in epi-pen for her to have in case future reactions worsen.  Discussed symptoms of anaphylaxis.       I discussed the assessment and treatment plan with the patient. The patient was provided an opportunity to ask questions and all were answered. The patient agreed with the plan and demonstrated an understanding of the instructions.   The patient was advised to call back or seek an in-person evaluation if the symptoms worsen or if the condition fails to improve as anticipated.    Lorraine Nutting, DO

## 2020-09-23 NOTE — Assessment & Plan Note (Signed)
Localized reaction Improved since initial onset a few days ago.  She may continue benadryl as needed.  Declines steroids as symptoms are resolving.   She does not recall having reactions like this in the past to bee stings. Will send in epi-pen for her to have in case future reactions worsen.  Discussed symptoms of anaphylaxis.

## 2020-09-25 ENCOUNTER — Other Ambulatory Visit: Payer: Self-pay | Admitting: Family Medicine

## 2020-09-25 DIAGNOSIS — Z1231 Encounter for screening mammogram for malignant neoplasm of breast: Secondary | ICD-10-CM

## 2020-10-01 ENCOUNTER — Other Ambulatory Visit: Payer: Self-pay | Admitting: Family Medicine

## 2020-10-22 ENCOUNTER — Other Ambulatory Visit: Payer: Self-pay

## 2020-10-22 ENCOUNTER — Ambulatory Visit: Payer: BC Managed Care – PPO

## 2020-10-22 ENCOUNTER — Ambulatory Visit (INDEPENDENT_AMBULATORY_CARE_PROVIDER_SITE_OTHER): Payer: BC Managed Care – PPO

## 2020-10-22 DIAGNOSIS — Z1231 Encounter for screening mammogram for malignant neoplasm of breast: Secondary | ICD-10-CM | POA: Diagnosis not present

## 2020-11-11 ENCOUNTER — Telehealth (INDEPENDENT_AMBULATORY_CARE_PROVIDER_SITE_OTHER): Payer: BC Managed Care – PPO | Admitting: Family Medicine

## 2020-11-11 ENCOUNTER — Encounter: Payer: Self-pay | Admitting: Family Medicine

## 2020-11-11 VITALS — Temp 98.6°F | Ht 62.0 in | Wt 171.0 lb

## 2020-11-11 DIAGNOSIS — N926 Irregular menstruation, unspecified: Secondary | ICD-10-CM | POA: Diagnosis not present

## 2020-11-11 DIAGNOSIS — Z1211 Encounter for screening for malignant neoplasm of colon: Secondary | ICD-10-CM | POA: Diagnosis not present

## 2020-11-11 MED ORDER — MEDROXYPROGESTERONE ACETATE 5 MG PO TABS: 5.0000 mg | ORAL_TABLET | Freq: Every day | ORAL | 0 refills | Status: DC

## 2020-11-11 NOTE — Progress Notes (Signed)
Pt repots that she didn't have a cycle in October. November 17th her cycle began and it was light not like her normal cycle. It was heavy for one day and now its really light. She c/o pain on her lower left side feels like a cramp and it has been constant pain. She has taken Advil and this has helped but she really doesn't want to take more medication.

## 2020-11-11 NOTE — Progress Notes (Signed)
Virtual Visit via Video Note  I connected with Lorraine Ayers on 11/11/20 at  3:40 PM EST by a video enabled telemedicine application and verified that I am speaking with the correct person using two identifiers.   I discussed the limitations of evaluation and management by telemedicine and the availability of in person appointments. The patient expressed understanding and agreed to proceed.  Patient location: at work  Provider location: in office  Subjective:    CC: prolonged period  HPI:  Pt repots that she didn't have a cycle in October. November 17th her cycle began and it was light not like her normal cycle. It was heavy for one day and now its really light. She c/o pain on her lower left side feels like a cramp and it has been constant pain. She has taken Advil and this has helped but she really doesn't want to take more medication.  Normally her periods last about 5 days she'll usually have 2 light days and 3 heavy days total.  She did have a cervical polyp back in 2017 that was removed.  It was benign and there was no atypia.  She denies any vaginal symptoms such as abnormal discharge, irritation itching or rash.    Past medical history, Surgical history, Family history not pertinant except as noted below, Social history, Allergies, and medications have been entered into the medical record, reviewed, and corrections made.   Review of Systems: No fevers, chills, night sweats, weight loss, chest pain, or shortness of breath.   Objective:    General: Speaking clearly in complete sentences without any shortness of breath.  Alert and oriented x3.  Normal judgment. No apparent acute distress.    Impression and Recommendations:    No problem-specific Assessment & Plan notes found for this encounter.  Prolonged period -likely related to perimenopause.  We discussed additional work-up including pelvic ultrasound, STD testing, pelvic exam and maybe even up-to-date Pap smear though  she is within her renewal.  Its been about 4-1/2 years.  She does have a history of ovarian cysts.  And thinks that she may have even had one that ruptured last month.  Discussed using Provera short-term to try to stop her bleeding though sometimes it will cause repeat bleeding at the end of the cycle but sometimes it will reset it.  She  has an appointment coming up in December and so we can do a pelvic exam at that time and STD testing.    Time spent in encounter 20 minutes  I discussed the assessment and treatment plan with the patient. The patient was provided an opportunity to ask questions and all were answered. The patient agreed with the plan and demonstrated an understanding of the instructions.   The patient was advised to call back or seek an in-person evaluation if the symptoms worsen or if the condition fails to improve as anticipated.   Beatrice Lecher, MD

## 2020-11-19 ENCOUNTER — Other Ambulatory Visit: Payer: Self-pay | Admitting: Family Medicine

## 2020-11-27 ENCOUNTER — Ambulatory Visit: Payer: BC Managed Care – PPO | Admitting: Family Medicine

## 2020-11-30 ENCOUNTER — Encounter: Payer: Self-pay | Admitting: Family Medicine

## 2020-12-01 ENCOUNTER — Ambulatory Visit: Payer: BC Managed Care – PPO | Admitting: Family Medicine

## 2020-12-03 ENCOUNTER — Other Ambulatory Visit: Payer: Self-pay | Admitting: Family Medicine

## 2021-01-09 ENCOUNTER — Encounter: Payer: BC Managed Care – PPO | Admitting: Gastroenterology

## 2021-01-13 ENCOUNTER — Telehealth: Payer: Self-pay | Admitting: Family Medicine

## 2021-05-26 ENCOUNTER — Other Ambulatory Visit: Payer: Self-pay | Admitting: Family Medicine

## 2021-05-29 ENCOUNTER — Other Ambulatory Visit: Payer: Self-pay | Admitting: Family Medicine

## 2021-08-12 ENCOUNTER — Encounter: Payer: Self-pay | Admitting: Family Medicine

## 2021-08-30 ENCOUNTER — Other Ambulatory Visit: Payer: Self-pay | Admitting: Family Medicine

## 2021-09-09 ENCOUNTER — Telehealth: Payer: Self-pay | Admitting: Family Medicine

## 2021-09-09 NOTE — Telephone Encounter (Signed)
Please call patient and see if she has had her Pap smear and mammogram done locally and if she is no longer really living directly in this area.  If not then please let her know that she is due.  And we are happy to do that here or if she wants to find a GYN closer to home to do that for her than that is perfectly fine as well.

## 2021-09-10 NOTE — Telephone Encounter (Signed)
Number listed was the wrong number.  Sent pt Mychart message.  Charyl Bigger, CMA

## 2021-09-16 NOTE — Telephone Encounter (Signed)
Received MyChart message from pt in response to this encounter.  Phone note closed.  Charyl Bigger, CMA

## 2021-09-26 ENCOUNTER — Other Ambulatory Visit: Payer: Self-pay | Admitting: Family Medicine

## 2021-09-29 NOTE — Telephone Encounter (Signed)
LVM for patient to call back to get this f/u appt scheduled. AM

## 2021-09-29 NOTE — Telephone Encounter (Signed)
Appointment required for refills. Please call pt and inform her to schedule this. thanks

## 2021-10-01 ENCOUNTER — Other Ambulatory Visit: Payer: Self-pay | Admitting: Family Medicine

## 2021-10-01 DIAGNOSIS — Z1231 Encounter for screening mammogram for malignant neoplasm of breast: Secondary | ICD-10-CM

## 2021-10-12 ENCOUNTER — Other Ambulatory Visit: Payer: Self-pay | Admitting: Family Medicine

## 2021-10-20 ENCOUNTER — Encounter: Payer: Self-pay | Admitting: Family Medicine

## 2021-10-20 ENCOUNTER — Other Ambulatory Visit: Payer: Self-pay

## 2021-10-20 ENCOUNTER — Ambulatory Visit (INDEPENDENT_AMBULATORY_CARE_PROVIDER_SITE_OTHER): Payer: BC Managed Care – PPO | Admitting: Family Medicine

## 2021-10-20 ENCOUNTER — Other Ambulatory Visit (HOSPITAL_COMMUNITY)
Admission: RE | Admit: 2021-10-20 | Discharge: 2021-10-20 | Disposition: A | Discharge status: Home / Self Care | Payer: BC Managed Care – PPO | Source: Ambulatory Visit | Attending: Family Medicine | Admitting: Family Medicine

## 2021-10-20 VITALS — BP 124/70 | HR 90 | Ht 65.0 in | Wt 181.0 lb

## 2021-10-20 DIAGNOSIS — Z124 Encounter for screening for malignant neoplasm of cervix: Secondary | ICD-10-CM | POA: Insufficient documentation

## 2021-10-20 DIAGNOSIS — Z0000 Encounter for general adult medical examination without abnormal findings: Secondary | ICD-10-CM

## 2021-10-20 DIAGNOSIS — Z1211 Encounter for screening for malignant neoplasm of colon: Secondary | ICD-10-CM

## 2021-10-20 DIAGNOSIS — M5136 Other intervertebral disc degeneration, lumbar region: Secondary | ICD-10-CM

## 2021-10-20 DIAGNOSIS — E611 Iron deficiency: Secondary | ICD-10-CM

## 2021-10-20 DIAGNOSIS — I10 Essential (primary) hypertension: Secondary | ICD-10-CM

## 2021-10-20 DIAGNOSIS — T63441A Toxic effect of venom of bees, accidental (unintentional), initial encounter: Secondary | ICD-10-CM

## 2021-10-20 MED ORDER — LISINOPRIL-HYDROCHLOROTHIAZIDE 20-25 MG PO TABS: 1.0000 | ORAL_TABLET | Freq: Every day | ORAL | 1 refills | Status: DC

## 2021-10-20 MED ORDER — EPINEPHRINE 0.3 MG/0.3ML IJ SOAJ: 0.3000 mg | INTRAMUSCULAR | 0 refills | Status: AC | PRN

## 2021-10-20 MED ORDER — MELOXICAM 15 MG PO TABS: 15.0000 mg | ORAL_TABLET | Freq: Every day | ORAL | 4 refills | Status: DC | PRN

## 2021-10-20 MED ORDER — CITALOPRAM HYDROBROMIDE 20 MG PO TABS: 20.0000 mg | ORAL_TABLET | Freq: Every day | ORAL | 1 refills | Status: DC

## 2021-10-20 NOTE — Assessment & Plan Note (Signed)
Refilled her EpiPen.

## 2021-10-20 NOTE — Assessment & Plan Note (Signed)
Recommend trial meloxicam instead of frequent use of ibuprofen.  Might be easier on her stomach as well.

## 2021-10-20 NOTE — Assessment & Plan Note (Signed)
Due to recheck iron.

## 2021-10-20 NOTE — Assessment & Plan Note (Signed)
Blood pressure looks fabulous today we will make sure that she has refills.  Plan to follow-up in 6 months.  Encouraged her to go for fasting labs this month.

## 2021-10-20 NOTE — Progress Notes (Signed)
Subjective:     Lorraine Ayers is a 50 y.o. female and is here for a comprehensive physical exam. The patient reports no problems. Couple of weeks ago her BP shot up. She wasn't feeling well and it was a stressful week.   She had the nurses will check her blood pressure and it was 160/80.  She been having some bad headaches that week as well.  She said it gradually got better and improved and has checked her blood pressure couple times since then it seems to have come down.  She did recently move her lisinopril to nighttime because she was getting a lot of nausea in the morning with her iron so she was trying to move things around to see if that made a difference.  She finally just stopped the iron and is now just taking a Centrum multivitamin for women over 50 and says she is doing well with that.  She had actually quit having her period for about 6 months and then it started back again and has been regular ever since.  She is due to start her period today.  She is happy with her current citalopram as well as her blood pressure pill.  She has been trying to get in about 20,000 steps and walk for 20 minutes a day total.  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  She has been taking IBU , 2 tabs BID for her back. That does seem to help.  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  She is planning on trying to get a sit/stand desk to see if that helps.   Social History   Socioeconomic History   Marital status: Married    Spouse name: Not on file   Number of children: Not on file   Years of education: Not on file   Highest education level: Not on file  Occupational History   Not on file  Tobacco Use   Smoking status: Never   Smokeless tobacco: Never  Substance and Sexual Activity   Alcohol use: Yes    Alcohol/week: 1.0 standard drink    Types: 1  Cans of beer per week   Drug use: No   Sexual activity: Yes  Other Topics Concern   Not on file  Social History Narrative   Not on file   Social Determinants of Health   Financial Resource Strain: Not on file  Food Insecurity: Not on file  Transportation Needs: Not on file  Physical Activity: Not on file  Stress: Not on file  Social Connections: Not on file  Intimate Partner Violence: Not on file   Health Maintenance  Topic Date Due   HIV Screening  Never done   Hepatitis C Screening  Never done   COLONOSCOPY (Pts 45-76yrs Insurance coverage will need to be confirmed)  Never done   Zoster Vaccines- Shingrix (1 of 2) Never done   PAP SMEAR-Modifier  06/18/2021   MAMMOGRAM  10/22/2022   TETANUS/TDAP  05/22/2024   INFLUENZA VACCINE  Completed   COVID-19 Vaccine  Completed   Pneumococcal Vaccine 82-46 Years old  Aged Out   HPV VACCINES  Aged Out    The following portions of the patient's history were reviewed and updated as appropriate: allergies, current medications, past family history, past medical history, past social history, past surgical  history, and problem list.  Review of Systems Pertinent items are noted in HPI.   Objective:    BP 124/70   Pulse 90   Ht 5\' 5"  (1.651 m)   Wt 181 lb (82.1 kg)   SpO2 100%   BMI 30.12 kg/m  General appearance: alert, cooperative, and appears stated age Head: Normocephalic, without obvious abnormality, atraumatic Eyes:  conj clear, EOMi, PEERLA Ears: normal TM's and external ear canals both ears Nose: Nares normal. Septum midline. Mucosa normal. No drainage or sinus tenderness. Throat: lips, mucosa, and tongue normal; teeth and gums normal Neck: no adenopathy, no carotid bruit, no JVD, supple, symmetrical, trachea midline, and thyroid not enlarged, symmetric, no tenderness/mass/nodules Back: symmetric, no curvature. ROM normal. No CVA tenderness. Lungs: clear to auscultation bilaterally Breasts: normal appearance, no masses  or tenderness Heart: regular rate and rhythm, S1, S2 normal, no murmur, click, rub or gallop Abdomen: soft, non-tender; bowel sounds normal; no masses,  no organomegaly Pelvic: cervix normal in appearance, external genitalia normal, no adnexal masses or tenderness, no cervical motion tenderness, rectovaginal septum normal, uterus normal size, shape, and consistency, and vagina normal without discharge Extremities: extremities normal, atraumatic, no cyanosis or edema Pulses: 2+ and symmetric Skin: Skin color, texture, turgor normal. No rashes or lesions Lymph nodes: Cervical, supraclavicular, and axillary nodes normal. Neurologic: Grossly normal    Assessment:    Healthy female exam.      Plan:     See After Visit Summary for Counseling Recommendations  Keep up a regular exercise program and make sure you are eating a healthy diet Try to eat 4 servings of dairy a day, or if you are lactose intolerant take a calcium with vitamin D daily.  Your vaccines are up to date.  Due for labs.    Essential hypertension Blood pressure looks fabulous today we will make sure that she has refills.  Plan to follow-up in 6 months.  Encouraged her to go for fasting labs this month.  Bee sting Refilled her EpiPen.  Low iron Due to recheck iron.  Lumbar degenerative disc disease Recommend trial meloxicam instead of frequent use of ibuprofen.  Might be easier on her stomach as well.

## 2021-10-22 LAB — CYTOLOGY - PAP
Comment: NEGATIVE
Diagnosis: NEGATIVE
High risk HPV: NEGATIVE

## 2021-10-22 NOTE — Progress Notes (Signed)
Your Pap smear is normal. You are negative for HPV as well. Repeat pap smear in 5 years.

## 2021-11-11 ENCOUNTER — Ambulatory Visit: Payer: BC Managed Care – PPO

## 2021-11-18 ENCOUNTER — Ambulatory Visit (INDEPENDENT_AMBULATORY_CARE_PROVIDER_SITE_OTHER): Payer: BC Managed Care – PPO

## 2021-11-18 ENCOUNTER — Other Ambulatory Visit: Payer: Self-pay

## 2021-11-18 DIAGNOSIS — Z1231 Encounter for screening mammogram for malignant neoplasm of breast: Secondary | ICD-10-CM

## 2021-11-19 NOTE — Progress Notes (Signed)
Please call patient. Normal mammogram.  Repeat in 1 year.  

## 2022-02-15 ENCOUNTER — Ambulatory Visit: Payer: BC Managed Care – PPO | Admitting: Family Medicine

## 2022-02-15 ENCOUNTER — Encounter: Payer: Self-pay | Admitting: Family Medicine

## 2022-02-15 ENCOUNTER — Other Ambulatory Visit: Payer: Self-pay

## 2022-02-15 VITALS — BP 120/81 | HR 95 | Resp 18 | Ht 65.0 in | Wt 185.0 lb

## 2022-02-15 DIAGNOSIS — J019 Acute sinusitis, unspecified: Secondary | ICD-10-CM

## 2022-02-15 MED ORDER — FLUCONAZOLE 150 MG PO TABS: 150.0000 mg | ORAL_TABLET | Freq: Once | ORAL | 0 refills | Status: AC

## 2022-02-15 MED ORDER — PREDNISONE 20 MG PO TABS: 40.0000 mg | ORAL_TABLET | Freq: Every day | ORAL | 0 refills | Status: DC

## 2022-02-15 MED ORDER — AMOXICILLIN-POT CLAVULANATE 875-125 MG PO TABS: 1.0000 | ORAL_TABLET | Freq: Two times a day (BID) | ORAL | 0 refills | Status: DC

## 2022-02-15 NOTE — Progress Notes (Signed)
? ?Acute Office Visit ? ?Subjective:  ? ? Patient ID: Lorraine Ayers, female    DOB: [birth year], 51 y.o.   MRN: [id number] ? ?Chief Complaint  ?Patient presents with  ? Laryngitis  ?  1 month, nasal congestion, right ear pain and headache.   ? Mammogram  ?  Done 12/2021.   ? Colonoscopy  ?  Patient will call to schedule appointment   ? ? ?HPI ?Patient is in today for sinus pressure and congestion for about a month.  She is also lost her voice and has had a really raspy voice.  In the last couple days she started to develop some right ear pain and some headaches.  She was outside cutting trees this weekend.  She never developed a fever.  There is a lot of facial pressure over the maxillary sinuses bilaterally. ? ?No past medical history on file. ? ?Past Surgical History:  ?Procedure Laterality Date  ? CESAREAN SECTION  5-93  ? KNEE SURGERY  2008  ? acl REPAIR ON LEFT KNEE.   ? TUBAL LIGATION  12-98  ? ? ?Family History  ?Problem Relation Age of Onset  ? Heart attack Father   ? Hyperlipidemia Father   ? Hypertension Father   ? Alcohol abuse Other   ? Dementia Mother   ? Uterine cancer Mother   ? ? ?Social History  ? ?Socioeconomic History  ? Marital status: Married  ?  Spouse name: Not on file  ? Number of children: Not on file  ? Years of education: Not on file  ? Highest education level: Not on file  ?Occupational History  ? Not on file  ?Tobacco Use  ? Smoking status: Never  ? Smokeless tobacco: Never  ?Substance and Sexual Activity  ? Alcohol use: Yes  ?  Alcohol/week: 1.0 standard drink  ?  Types: 1 Cans of beer per week  ? Drug use: No  ? Sexual activity: Yes  ?Other Topics Concern  ? Not on file  ?Social History Narrative  ? Not on file  ? ?Social Determinants of Health  ? ?Financial Resource Strain: Not on file  ?Food Insecurity: Not on file  ?Transportation Needs: Not on file  ?Physical Activity: Not on file  ?Stress: Not on file  ?Social Connections: Not on file  ?Intimate Partner Violence: Not on  file  ? ? ?Outpatient Medications Prior to Visit  ?Medication Sig Dispense Refill  ? citalopram (CELEXA) 20 MG tablet Take 1 tablet (20 mg total) by mouth daily. 90 tablet 1  ? EPINEPHrine 0.3 mg/0.3 mL IJ SOAJ injection Inject 0.3 mg into the muscle as needed for anaphylaxis. 1 each 0  ? lisinopril-hydrochlorothiazide (ZESTORETIC) 20-25 MG tablet Take 1 tablet by mouth daily. 90 tablet 1  ? meloxicam (MOBIC) 15 MG tablet Take 1 tablet (15 mg total) by mouth daily as needed for pain. (Patient not taking: Reported on 02/15/2022) 30 tablet 4  ? ?No facility-administered medications prior to visit.  ? ? ?No Known Allergies ? ?Review of Systems ? ?   ?Objective:  ?  ?Physical Exam ?Constitutional:   ?   Appearance: She is well-developed.  ?HENT:  ?   Head: Normocephalic and atraumatic.  ?   Right Ear: External ear normal.  ?   Left Ear: External ear normal.  ?   Nose: Nose normal.  ?Eyes:  ?   Conjunctiva/sclera: Conjunctivae normal.  ?   Pupils: Pupils are equal, round, and reactive to light.  ?Neck:  ?  Thyroid: No thyromegaly.  ?Cardiovascular:  ?   Rate and Rhythm: Normal rate and regular rhythm.  ?   Heart sounds: Normal heart sounds.  ?Pulmonary:  ?   Effort: Pulmonary effort is normal.  ?   Breath sounds: Normal breath sounds. No wheezing.  ?Musculoskeletal:  ?   Cervical back: Neck supple.  ?Lymphadenopathy:  ?   Cervical: No cervical adenopathy.  ?Skin: ?   General: Skin is warm and dry.  ?Neurological:  ?   Mental Status: She is alert and oriented to person, place, and time.  ? ? ?BP 120/81   Pulse 95   Resp 18   Ht '5\' 5"'$  (1.651 m)   Wt 185 lb (83.9 kg)   LMP 01/25/2022   SpO2 96%   BMI 30.79 kg/m?  ?Wt Readings from Last 3 Encounters:  ?02/15/22 185 lb (83.9 kg)  ?10/20/21 181 lb (82.1 kg)  ?11/11/20 171 lb (77.6 kg)  ? ? ?Health Maintenance Due  ?Topic Date Due  ? HIV Screening  Never done  ? Hepatitis C Screening  Never done  ? COLONOSCOPY (Pts 45-64yr Insurance coverage will need to be confirmed)   Never done  ? ? ?There are no preventive care reminders to display for this patient. ? ? ?Lab Results  ?Component Value Date  ? TSH 1.72 05/30/2020  ? ?Lab Results  ?Component Value Date  ? WBC 7.3 05/30/2020  ? HGB 11.3 (L) 05/30/2020  ? HCT 35.9 05/30/2020  ? MCV 72.8 (L) 05/30/2020  ? PLT 316 05/30/2020  ? ?Lab Results  ?Component Value Date  ? NA 137 05/30/2020  ? K 4.3 05/30/2020  ? CO2 27 05/30/2020  ? GLUCOSE 87 05/30/2020  ? BUN 18 05/30/2020  ? CREATININE 0.80 05/30/2020  ? BILITOT 0.5 05/30/2020  ? ALKPHOS 71 06/18/2016  ? AST 15 05/30/2020  ? ALT 14 05/30/2020  ? PROT 7.4 05/30/2020  ? ALBUMIN 4.0 06/18/2016  ? CALCIUM 9.6 05/30/2020  ? ?Lab Results  ?Component Value Date  ? CHOL 208 (H) 06/28/2017  ? ?Lab Results  ?Component Value Date  ? HDL 65 06/28/2017  ? ?No results found for: [place]?Lab Results  ?Component Value Date  ? TRIG 141 06/28/2017  ? ?Lab Results  ?Component Value Date  ? CHOLHDL 3.2 06/28/2017  ? ?No results found for: HGBA1C ? ?   ?Assessment & Plan:  ? ?Problem List Items Addressed This Visit   ?None ?Visit Diagnoses   ? ? Acute non-recurrent sinusitis, unspecified location    -  Primary  ? Relevant Medications  ? amoxicillin-clavulanate (AUGMENTIN) 875-125 MG tablet  ? predniSONE (DELTASONE) 20 MG tablet  ? fluconazole (DIFLUCAN) 150 MG tablet  ? ?  ? ?Acute sinusitis with symptoms x4 weeks-we will go ahead and treat with Augmentin and prednisone.  Did send over prescription for Diflucan as well.  If not feeling better after 1 week then please let uKoreaknow. ? ?Meds ordered this encounter  ?Medications  ? amoxicillin-clavulanate (AUGMENTIN) 875-125 MG tablet  ?  Sig: Take 1 tablet by mouth 2 (two) times daily.  ?  Dispense:  14 tablet  ?  Refill:  0  ? predniSONE (DELTASONE) 20 MG tablet  ?  Sig: Take 2 tablets (40 mg total) by mouth daily with breakfast.  ?  Dispense:  10 tablet  ?  Refill:  0  ? fluconazole (DIFLUCAN) 150 MG tablet  ?  Sig: Take 1 tablet (150 mg total) by mouth once  for 1 dose.  ?  Dispense:  1 tablet  ?  Refill:  0  ? ? ? ?Beatrice Lecher, MD ? ?

## 2022-05-12 ENCOUNTER — Encounter: Payer: Self-pay | Admitting: Family Medicine

## 2022-06-24 ENCOUNTER — Other Ambulatory Visit: Payer: Self-pay | Admitting: Family Medicine

## 2022-09-05 ENCOUNTER — Other Ambulatory Visit: Payer: Self-pay | Admitting: Family Medicine

## 2022-09-05 DIAGNOSIS — I10 Essential (primary) hypertension: Secondary | ICD-10-CM

## 2022-12-10 ENCOUNTER — Telehealth: Payer: Self-pay | Admitting: Family Medicine

## 2022-12-10 DIAGNOSIS — I10 Essential (primary) hypertension: Secondary | ICD-10-CM

## 2022-12-10 NOTE — Telephone Encounter (Signed)
Patient is scheduled for 01/04/2023 for medication refill, thanks.

## 2022-12-10 NOTE — Telephone Encounter (Signed)
Please contact patient to schedule appt with PCP. Last seen 10/2021. Sending 30 day medication refill. Thanks

## 2022-12-20 ENCOUNTER — Ambulatory Visit: Payer: BC Managed Care – PPO | Admitting: Family Medicine

## 2022-12-20 ENCOUNTER — Encounter: Payer: Self-pay | Admitting: Family Medicine

## 2022-12-20 VITALS — BP 127/80 | HR 76 | Ht 65.0 in | Wt 183.0 lb

## 2022-12-20 DIAGNOSIS — E611 Iron deficiency: Secondary | ICD-10-CM

## 2022-12-20 DIAGNOSIS — F39 Unspecified mood [affective] disorder: Secondary | ICD-10-CM

## 2022-12-20 DIAGNOSIS — I10 Essential (primary) hypertension: Secondary | ICD-10-CM

## 2022-12-20 DIAGNOSIS — R232 Flushing: Secondary | ICD-10-CM | POA: Diagnosis not present

## 2022-12-20 DIAGNOSIS — Z7689 Persons encountering health services in other specified circumstances: Secondary | ICD-10-CM

## 2022-12-20 MED ORDER — CITALOPRAM HYDROBROMIDE 20 MG PO TABS: 20.0000 mg | ORAL_TABLET | Freq: Every day | ORAL | 3 refills | Status: DC

## 2022-12-20 NOTE — Assessment & Plan Note (Signed)
Pressure looks fantastic today.  Continue current regimen.

## 2022-12-20 NOTE — Patient Instructions (Signed)
Over-the-counter herbal supplements such as black cohosh or evening primrose oil or increasing soy in the diet.

## 2022-12-20 NOTE — Progress Notes (Signed)
Established Patient Office Visit  Subjective   Patient ID: Belen Pesch, female    DOB: 22-Feb-1971  Age: 52 y.o. MRN: [id number]  Chief Complaint  Patient presents with   Hypertension    HPI  Hypertension- Pt denies chest pain, SOB, dizziness, or heart palpitations.  Taking meds as directed w/o problems.  Denies medication side effects.  Feels like she is finally got her blood pressure under good control.  Follow-up mood-she is currently on citalopram 20 mg daily.  Just wanted to discuss her weight.  She feels like it tends to go up and down by about 10 pounds.  She really has gotten back on track with eating better, drinking more water.  She is cut out all soda except for 1/day.  She has been trying to walk for about 45 minutes / 2 miles per day.  She just cannot get past that initial 10 pound weight loss and wants to know what to do.  Says ultimately she would like to stay healthy her father had an MI at 27.  Her last period was in August 2022.  And since then she has really been struggling with hot flashes.    ROS    Objective:     BP 127/80   Pulse 76   Ht '5\' 5"'$  (1.651 m)   Wt 183 lb (83 kg)   SpO2 96%   BMI 30.45 kg/m    Physical Exam Vitals and nursing note reviewed.  Constitutional:      Appearance: She is well-developed.  HENT:     Head: Normocephalic and atraumatic.  Cardiovascular:     Rate and Rhythm: Normal rate and regular rhythm.     Heart sounds: Normal heart sounds.  Pulmonary:     Effort: Pulmonary effort is normal.     Breath sounds: Normal breath sounds.  Skin:    General: Skin is warm and dry.  Neurological:     Mental Status: She is alert and oriented to person, place, and time.  Psychiatric:        Behavior: Behavior normal.      No results found for any visits on 12/20/22.    The ASCVD Risk score (Arnett DK, et al., 2019) failed to calculate for the following reasons:   Cannot find a previous HDL lab   Cannot find a  previous total cholesterol lab    Assessment & Plan:   Problem List Items Addressed This Visit       Cardiovascular and Mediastinum   Hot flashes    Over-the-counter herbal supplements such as black cohosh or evening primrose oil or increasing soy in the diet.      Essential hypertension - Primary    Pressure looks fantastic today.  Continue current regimen.      Relevant Orders   Lipid Panel w/reflex Direct LDL   COMPLETE METABOLIC PANEL WITH GFR   CBC     Other   Low iron    Not had a period in over a year.  Plan to recheck iron again if it looks great then we do not need to follow it yearly.      Relevant Orders   Fe+TIBC+Fer   Episodic mood disorder ([place])    She really is happy with her current regimen and even friends have noted that she just seems positive and like herself.  She would like to continue with the citalopram for now so we will refill for 6 more months.  Relevant Medications   citalopram (CELEXA) 20 MG tablet   Encounter for weight management    Actually pretty excited to hear about the changes that she has made with her diet and exercising regularly.  It sounds like she really has made some lifestyle changes.  She is just frustrated that her weight is really not moving.  We discussed really increasing vegetable intake throughout the day and eating more small snacks instead of meals.  Her hunger really ramps up between 4 and 5 PM so eating a mid afternoon snack.  Also adding in some weight resistance.      Hot flashes - Over-the-counter herbal supplements such as black cohosh or evening primrose oil or increasing soy in the diet.  Return in about 6 months (around 06/20/2023) for Hypertension.    Beatrice Lecher, MD

## 2022-12-20 NOTE — Assessment & Plan Note (Signed)
Over-the-counter herbal supplements such as black cohosh or evening primrose oil or increasing soy in the diet.

## 2022-12-20 NOTE — Assessment & Plan Note (Signed)
She really is happy with her current regimen and even friends have noted that she just seems positive and like herself.  She would like to continue with the citalopram for now so we will refill for 6 more months.

## 2022-12-20 NOTE — Assessment & Plan Note (Signed)
Actually pretty excited to hear about the changes that she has made with her diet and exercising regularly.  It sounds like she really has made some lifestyle changes.  She is just frustrated that her weight is really not moving.  We discussed really increasing vegetable intake throughout the day and eating more small snacks instead of meals.  Her hunger really ramps up between 4 and 5 PM so eating a mid afternoon snack.  Also adding in some weight resistance.

## 2022-12-20 NOTE — Assessment & Plan Note (Signed)
Not had a period in over a year.  Plan to recheck iron again if it looks great then we do not need to follow it yearly.

## 2022-12-21 LAB — COMPLETE METABOLIC PANEL WITH GFR
AG Ratio: 1.6 (calc) (ref 1.0–2.5)
ALT: 28 U/L (ref 6–29)
AST: 20 U/L (ref 10–35)
Albumin: 4.6 g/dL (ref 3.6–5.1)
Alkaline phosphatase (APISO): 93 U/L (ref 37–153)
BUN: 17 mg/dL (ref 7–25)
CO2: 30 mmol/L (ref 20–32)
Calcium: 9.7 mg/dL (ref 8.6–10.4)
Chloride: 98 mmol/L (ref 98–110)
Creat: 0.81 mg/dL (ref 0.50–1.03)
Globulin: 2.8 g/dL (calc) (ref 1.9–3.7)
Glucose, Bld: 92 mg/dL (ref 65–99)
Potassium: 3.9 mmol/L (ref 3.5–5.3)
Sodium: 137 mmol/L (ref 135–146)
Total Bilirubin: 0.7 mg/dL (ref 0.2–1.2)
Total Protein: 7.4 g/dL (ref 6.1–8.1)
eGFR: 87 mL/min/{1.73_m2} (ref >=60)

## 2022-12-21 LAB — CBC
HCT: 41.1 % (ref 35.0–45.0)
Hemoglobin: 14.1 g/dL (ref 11.7–15.5)
MCH: 29.3 pg (ref 27.0–33.0)
MCHC: 34.3 g/dL (ref 32.0–36.0)
MCV: 85.4 fL (ref 80.0–100.0)
MPV: 11.7 fL (ref 7.5–12.5)
Platelets: 249 10*3/uL (ref 140–400)
RBC: 4.81 10*6/uL (ref 3.80–5.10)
RDW: 12.1 % (ref 11.0–15.0)
WBC: 5.6 10*3/uL (ref 3.8–10.8)

## 2022-12-21 LAB — LIPID PANEL W/REFLEX DIRECT LDL
Cholesterol: 238 mg/dL — ABNORMAL HIGH (ref <200)
HDL: 79 mg/dL (ref >=50)
LDL Cholesterol (Calc): 145 mg/dL (calc) — ABNORMAL HIGH
Non-HDL Cholesterol (Calc): 159 mg/dL (calc) — ABNORMAL HIGH (ref <130)
Total CHOL/HDL Ratio: 3 (calc) (ref <5.0)
Triglycerides: 57 mg/dL (ref <150)

## 2022-12-21 LAB — IRON,TIBC AND FERRITIN PANEL
%SAT: 29 % (calc) (ref 16–45)
Ferritin: 13 ng/mL — ABNORMAL LOW (ref 16–232)
Iron: 120 ug/dL (ref 45–160)
TIBC: 413 mcg/dL (calc) (ref 250–450)

## 2022-12-21 NOTE — Progress Notes (Signed)
Hi Kathy, LDL is up to 145 goal is less than 100.  Current cardiovascular risk for overall is low so for now just continue to work on healthy diet and regular exercise.  Your total iron looks great.  Your ferritin is still low but it is better than it was 2 years ago so please continue taking extra iron in your diet and possibly with a supplement.  Blood count metabolic panel look good.  Let us know if we can assist you in getting your colon cancer screening scheduled.  The 10-year ASCVD risk score (Arnett DK, et al., 2019) is: 1.8%   Values used to calculate the score:     Age: 57 years     Sex: Female     Is Non-Hispanic African American: No     Diabetic: No     Tobacco smoker: No     Systolic Blood Pressure: 488 mmHg     Is BP treated: Yes     HDL Cholesterol: 79 mg/dL     Total Cholesterol: 238 mg/dL

## 2022-12-22 ENCOUNTER — Other Ambulatory Visit: Payer: Self-pay | Admitting: *Deleted

## 2022-12-22 DIAGNOSIS — Z1211 Encounter for screening for malignant neoplasm of colon: Secondary | ICD-10-CM

## 2022-12-23 ENCOUNTER — Other Ambulatory Visit: Payer: Self-pay | Admitting: Family Medicine

## 2022-12-23 DIAGNOSIS — I10 Essential (primary) hypertension: Secondary | ICD-10-CM

## 2023-01-04 ENCOUNTER — Ambulatory Visit: Payer: BC Managed Care – PPO | Admitting: Family Medicine

## 2023-01-18 ENCOUNTER — Other Ambulatory Visit: Payer: Self-pay | Admitting: Family Medicine

## 2023-01-18 DIAGNOSIS — I10 Essential (primary) hypertension: Secondary | ICD-10-CM

## 2023-02-15 ENCOUNTER — Encounter: Payer: Self-pay | Admitting: Family Medicine

## 2023-03-07 LAB — HM COLONOSCOPY

## 2023-06-20 ENCOUNTER — Encounter: Payer: Self-pay | Admitting: Family Medicine

## 2023-06-20 ENCOUNTER — Ambulatory Visit: Payer: BC Managed Care – PPO | Admitting: Family Medicine

## 2023-06-20 VITALS — BP 122/78 | HR 67 | Ht 65.0 in | Wt 188.0 lb

## 2023-06-20 DIAGNOSIS — Z23 Encounter for immunization: Secondary | ICD-10-CM

## 2023-06-20 DIAGNOSIS — Z79899 Other long term (current) drug therapy: Secondary | ICD-10-CM

## 2023-06-20 DIAGNOSIS — F39 Unspecified mood [affective] disorder: Secondary | ICD-10-CM

## 2023-06-20 DIAGNOSIS — I10 Essential (primary) hypertension: Secondary | ICD-10-CM | POA: Diagnosis not present

## 2023-06-20 DIAGNOSIS — N951 Menopausal and female climacteric states: Secondary | ICD-10-CM

## 2023-06-20 DIAGNOSIS — E611 Iron deficiency: Secondary | ICD-10-CM

## 2023-06-20 MED ORDER — CITALOPRAM HYDROBROMIDE 20 MG PO TABS: 20.0000 mg | ORAL_TABLET | Freq: Every day | ORAL | 3 refills | Status: DC

## 2023-06-20 MED ORDER — LISINOPRIL-HYDROCHLOROTHIAZIDE 20-25 MG PO TABS: 1.0000 | ORAL_TABLET | Freq: Every day | ORAL | 1 refills | Status: DC

## 2023-06-20 NOTE — Assessment & Plan Note (Signed)
She would like to continue with current regimen.  PHQ-9 score is 2 and she answered 0 to the first 2 questions.  GAD-7 score of 0 as well.

## 2023-06-20 NOTE — Progress Notes (Signed)
Established Patient Office Visit  Subjective   Patient ID: Lorraine Ayers, female    DOB: 1971-01-17  Age: 52 y.o. MRN: [id number]  Chief Complaint  Patient presents with   Hypertension    Sent for records/results from colonoscopy [id number])    HPI  Hypertension- Pt denies chest pain, SOB, dizziness, or heart palpitations.  Taking meds as directed w/o problems.  Denies medication side effects.    She did have her colonoscopy done in March.  Has not had a period in a year and has been experiencing some sleeplessness, weight gain and hot flashes.  She is currently taking some over-the-counter Estroven and she feels like it has been somewhat helpful.  She also more recently started doing intermittent fasting a couple of weeks ago and cut out sodas and sugars.  She has been trying to walk more in the evenings instead of coming home and just being more sedentary because she is exhausted.  She is already down about 4 pounds.  She says she is planning on sticking to this for at least 6 months she has made that commitment to herself.    ROS    Objective:     BP 122/78   Pulse 67   Ht 5\' 5"  (1.651 m)   Wt 188 lb (85.3 kg)   SpO2 99%   BMI 31.28 kg/m    Physical Exam Vitals and nursing note reviewed.  Constitutional:      Appearance: She is well-developed.  HENT:     Head: Normocephalic and atraumatic.  Cardiovascular:     Rate and Rhythm: Normal rate and regular rhythm.     Heart sounds: Normal heart sounds.  Pulmonary:     Effort: Pulmonary effort is normal.     Breath sounds: Normal breath sounds.  Skin:    General: Skin is warm and dry.  Neurological:     Mental Status: She is alert and oriented to person, place, and time.  Psychiatric:        Behavior: Behavior normal.      Results for orders placed or performed in visit on 06/20/23  HM COLONOSCOPY  Result Value Ref Range   HM Colonoscopy See Report (in chart) See Report (in chart), Patient  Reported      The 10-year ASCVD risk score (Arnett DK, et al., 2019) is: 1.9%    Assessment & Plan:   Problem List Items Addressed This Visit       Cardiovascular and Mediastinum   Menopausal hot flushes   Relevant Medications   lisinopril-hydrochlorothiazide (ZESTORETIC) 20-25 MG tablet   Essential hypertension - Primary    Plan to recheck blood pressure before she leaves today.      Relevant Medications   lisinopril-hydrochlorothiazide (ZESTORETIC) 20-25 MG tablet   Other Relevant Orders   Hemoglobin A1c     Other   Low iron    Low iron-she says the regular iron causes her stomach to hurt so recommended one of the vegetable-based iron is called Floranex to try.  Also continue to work on try to get through the diet.      Episodic mood disorder (HCC)    She would like to continue with current regimen.  PHQ-9 score is 2 and she answered 0 to the first 2 questions.  GAD-7 score of 0 as well.      Relevant Medications   citalopram (CELEXA) 20 MG tablet   Other Visit Diagnoses     Medication management  Relevant Orders   BASIC METABOLIC PANEL WITH GFR   Hemoglobin A1c   Encounter for immunization       Relevant Orders   Varicella-zoster vaccine IM (Completed)      Menopausal symptoms-currently using over-the-counter Estroven.  She does feel like her sleep quality has gotten a little bit better.  Think improving her diet and exercising regularly will continue to help as well.  Will do shingles vaccine today.    I spent 25 minutes on the day of the encounter to include pre-visit record review, face-to-face time with the patient and post visit ordering of test.   Return in about 6 months (around 12/28/2023) for Hypertension.    Nani Gasser, MD

## 2023-06-20 NOTE — Assessment & Plan Note (Signed)
Plan to recheck blood pressure before she leaves today.

## 2023-06-20 NOTE — Assessment & Plan Note (Signed)
Low iron-she says the regular iron causes her stomach to hurt so recommended one of the vegetable-based iron is called Floranex to try.  Also continue to work on try to get through the diet.

## 2023-06-21 LAB — BASIC METABOLIC PANEL WITH GFR
BUN: 17 mg/dL (ref 7–25)
CO2: 30 mmol/L (ref 20–32)
Calcium: 9.9 mg/dL (ref 8.6–10.4)
Chloride: 101 mmol/L (ref 98–110)
Creat: 0.88 mg/dL (ref 0.50–1.03)
Glucose, Bld: 100 mg/dL — ABNORMAL HIGH (ref 65–99)
Potassium: 4.1 mmol/L (ref 3.5–5.3)
Sodium: 138 mmol/L (ref 135–146)
eGFR: 78 mL/min/{1.73_m2} (ref >=60)

## 2023-06-21 LAB — HEMOGLOBIN A1C
Hgb A1c MFr Bld: 5.8 % of total Hgb — ABNORMAL HIGH (ref <5.7)
Mean Plasma Glucose: 120 mg/dL
eAG (mmol/L): 6.6 mmol/L

## 2023-06-21 NOTE — Progress Notes (Signed)
A1C shows borderline for pre-diabetes. Continue to work on healthy diet with low carb and low sugar.  Your metabolic panel is normal.

## 2023-06-24 ENCOUNTER — Encounter: Payer: Self-pay | Admitting: Family Medicine

## 2023-06-25 ENCOUNTER — Encounter: Payer: Self-pay | Admitting: Family Medicine

## 2023-12-05 ENCOUNTER — Ambulatory Visit: Payer: BC Managed Care – PPO | Admitting: Family Medicine

## 2023-12-05 ENCOUNTER — Ambulatory Visit (INDEPENDENT_AMBULATORY_CARE_PROVIDER_SITE_OTHER): Payer: BC Managed Care – PPO | Admitting: Family Medicine

## 2023-12-05 ENCOUNTER — Encounter: Payer: Self-pay | Admitting: Family Medicine

## 2023-12-05 VITALS — BP 125/78 | HR 78 | Ht 65.0 in | Wt 182.0 lb

## 2023-12-05 DIAGNOSIS — I10 Essential (primary) hypertension: Secondary | ICD-10-CM | POA: Diagnosis not present

## 2023-12-05 DIAGNOSIS — Z7689 Persons encountering health services in other specified circumstances: Secondary | ICD-10-CM

## 2023-12-05 DIAGNOSIS — Z1231 Encounter for screening mammogram for malignant neoplasm of breast: Secondary | ICD-10-CM | POA: Diagnosis not present

## 2023-12-05 DIAGNOSIS — R7301 Impaired fasting glucose: Secondary | ICD-10-CM

## 2023-12-05 DIAGNOSIS — Z23 Encounter for immunization: Secondary | ICD-10-CM

## 2023-12-05 DIAGNOSIS — F39 Unspecified mood [affective] disorder: Secondary | ICD-10-CM | POA: Diagnosis not present

## 2023-12-05 LAB — POCT GLYCOSYLATED HEMOGLOBIN (HGB A1C): Hemoglobin A1C: 5.5 % (ref 4.0–5.6)

## 2023-12-05 MED ORDER — LISINOPRIL-HYDROCHLOROTHIAZIDE 20-25 MG PO TABS: 1.0000 | ORAL_TABLET | Freq: Every day | ORAL | 1 refills | Status: DC

## 2023-12-05 MED ORDER — CITALOPRAM HYDROBROMIDE 20 MG PO TABS: 20.0000 mg | ORAL_TABLET | Freq: Every day | ORAL | 3 refills | Status: AC

## 2023-12-05 NOTE — Assessment & Plan Note (Signed)
Is on getting back on track with intermittent fasting after the holidays.  She is also going to try to start incorporating a daily routine walk in addition to her steps that she gets at work daily.  She says she is open to considering medication to help her in the future but really wants to work on it with lifestyle changes which I think would be fantastic.

## 2023-12-05 NOTE — Assessment & Plan Note (Signed)
Well controlled. Continue current regimen. Follow up in  38mo due for BMP in jan

## 2023-12-05 NOTE — Progress Notes (Signed)
Established Patient Office Visit  Subjective  Patient ID: Lorraine Ayers, female    DOB: [birth year]  Age: 53 y.o. MRN: [id number]  Chief Complaint  Patient presents with   Hypertension    HPI  Hypertension- Pt denies chest pain, SOB, dizziness, or heart palpitations.  Taking meds as directed w/o problems.  Denies medication side effects.    Impaired fasting glucose-no increased thirst or urination. No symptoms consistent with hypoglycemia.   Has been doing intermittent fasting to lose weight until Thanksgiving.    F/U mood - occ feels like she has "menopause brain".  Otherwise she feels like the citalopram 20 mg is working well she does not have any specific concerns with it she would like to continue with her current regimen.    ROS    Objective:     BP 125/78   Pulse 78   Ht 5\' 5"  (1.651 m)   Wt 182 lb (82.6 kg)   SpO2 99%   BMI 30.29 kg/m    Physical Exam Vitals and nursing note reviewed.  Constitutional:      Appearance: Normal appearance.  HENT:     Head: Normocephalic and atraumatic.  Eyes:     Conjunctiva/sclera: Conjunctivae normal.  Cardiovascular:     Rate and Rhythm: Normal rate and regular rhythm.  Pulmonary:     Effort: Pulmonary effort is normal.     Breath sounds: Normal breath sounds.  Skin:    General: Skin is warm and dry.  Neurological:     Mental Status: She is alert.  Psychiatric:        Mood and Affect: Mood normal.      Results for orders placed or performed in visit on 12/05/23  POCT HgB A1C  Result Value Ref Range   Hemoglobin A1C 5.5 4.0 - 5.6 %   HbA1c POC (<> result, manual entry)     HbA1c, POC (prediabetic range)     HbA1c, POC (controlled diabetic range)        The 10-year ASCVD risk score (Arnett DK, et al., 2019) is: 2%    Assessment & Plan:   Problem List Items Addressed This Visit       Cardiovascular and Mediastinum   Essential hypertension - Primary   Well controlled. Continue current  regimen. Follow up in  84mo due for BMP in jan      Relevant Medications   lisinopril-hydrochlorothiazide (ZESTORETIC) 20-25 MG tablet   Other Relevant Orders   Lipid Panel With LDL/HDL Ratio   CMP14+EGFR     Other   Episodic mood disorder (HCC)   Stable on current medication.  F/U in 6 months.  She wasn't to continue the 20mg  dose.       Relevant Medications   citalopram (CELEXA) 20 MG tablet   Encounter for weight management   Is on getting back on track with intermittent fasting after the holidays.  She is also going to try to start incorporating a daily routine walk in addition to her steps that she gets at work daily.  She says she is open to considering medication to help her in the future but really wants to work on it with lifestyle changes which I think would be fantastic.      Other Visit Diagnoses       IFG (impaired fasting glucose)       Relevant Orders   POCT HgB A1C (Completed)   Lipid Panel With LDL/HDL Ratio   CMP14+EGFR  Encounter for immunization       Relevant Orders   Varicella-zoster vaccine IM (Completed)     Screening mammogram for breast cancer       Relevant Orders   MM 3D SCREENING MAMMOGRAM BILATERAL BREAST       Return in about 6 months (around 06/04/2024) for Hypertension.    Nani Gasser, MD

## 2023-12-05 NOTE — Assessment & Plan Note (Signed)
Stable on current medication.  F/U in 6 months.  She wasn't to continue the 20mg  dose.

## 2024-01-18 ENCOUNTER — Encounter: Payer: Self-pay | Admitting: Family Medicine

## 2024-01-24 ENCOUNTER — Telehealth (INDEPENDENT_AMBULATORY_CARE_PROVIDER_SITE_OTHER): Payer: Self-pay | Admitting: Family Medicine

## 2024-01-24 ENCOUNTER — Encounter: Payer: Self-pay | Admitting: Family Medicine

## 2024-01-24 VITALS — Ht 65.0 in | Wt 185.0 lb

## 2024-01-24 DIAGNOSIS — I10 Essential (primary) hypertension: Secondary | ICD-10-CM

## 2024-01-24 DIAGNOSIS — Z7689 Persons encountering health services in other specified circumstances: Secondary | ICD-10-CM

## 2024-01-24 DIAGNOSIS — F39 Unspecified mood [affective] disorder: Secondary | ICD-10-CM

## 2024-01-24 NOTE — Progress Notes (Signed)
   Established Patient Office Visit  Subjective  Patient ID: Lorraine Ayers, female    DOB: [birth year]  Age: 53 y.o. MRN: [id number]  Chief Complaint  Patient presents with   Hypertension    HPI Questions about blood pressure medication.  She had a friend's mother who recently ended up on dialysis and so wanted to discuss her blood pressure medications.  She also wanted to discuss weight management.  She still struggling with getting her weight consistently down and she seems to be fluctuating back and forth.  Between 173 and 183.      ROS    Objective:     Ht 5\' 5"  (1.651 m)   Wt 185 lb (83.9 kg)   BMI 30.79 kg/m    Physical Exam Vitals and nursing note reviewed.  Constitutional:      Appearance: Normal appearance.  HENT:     Head: Normocephalic and atraumatic.  Eyes:     Conjunctiva/sclera: Conjunctivae normal.  Cardiovascular:     Rate and Rhythm: Normal rate and regular rhythm.  Pulmonary:     Effort: Pulmonary effort is normal.     Breath sounds: Normal breath sounds.  Skin:    General: Skin is warm and dry.  Neurological:     Mental Status: She is alert.  Psychiatric:        Mood and Affect: Mood normal.     No results found for any visits on 01/24/24.    The 10-year ASCVD risk score (Arnett DK, et al., 2019) is: 2%    Assessment & Plan:   Problem List Items Addressed This Visit       Cardiovascular and Mediastinum   Essential hypertension - Primary   She says that her blood pressures at home have looked great and they are really well-controlled on her current regimen.  Answered some questions in regards to her medication we did discuss how ACEs and ARB's are actually renally protected and we do need to make sure still following her renal function she will swing by to get updated blood work.        Other   Episodic mood disorder (HCC)   Continue current regimen with citalopram.      Encounter for weight management   She has been  working hard on making some good lifestyle changes she is exercising more regularly.  She has been walking 2 miles 5 days a week.  She has really drink mostly water.  She eats at home and has cut out eating out completely.  Her husband does a lot of the meal prep but is making good food selections.  Her neck step that she is planning on looking at is really working on portion size.  She has not completely cut sugar out but has cut it back..  She really is doing great and putting in those changes.  Do encourage her to continue to work on portion control to set caloric intake goals.  If not noticing improvement to getting of her goal of around 1 55-1 65 over the next 4 months then please let me know.  We did discuss how things like stress, lack of sleep and even being postmenopausal can all make it a little bit more difficult to lose weight.  She is happy with her Celexa and does not want to make any changes in that regard.       No follow-ups on file.    Nani Gasser, MD

## 2024-01-24 NOTE — Progress Notes (Signed)
Pt would like to discuss the long term effects of the lisinopril-hct on her kidneys.  She is also questioning whether or not her medications could be causing her not to be able to loose weight. She reports that she has cut back on eating out,alcohol consumption,she is walking 2 miles five days a week

## 2024-01-24 NOTE — Assessment & Plan Note (Signed)
She has been working hard on making some good lifestyle changes she is exercising more regularly.  She has been walking 2 miles 5 days a week.  She has really drink mostly water.  She eats at home and has cut out eating out completely.  Her husband does a lot of the meal prep but is making good food selections.  Her neck step that she is planning on looking at is really working on portion size.  She has not completely cut sugar out but has cut it back..  She really is doing great and putting in those changes.  Do encourage her to continue to work on portion control to set caloric intake goals.  If not noticing improvement to getting of her goal of around 1 55-1 65 over the next 4 months then please let me know.  We did discuss how things like stress, lack of sleep and even being postmenopausal can all make it a little bit more difficult to lose weight.  She is happy with her Celexa and does not want to make any changes in that regard.

## 2024-01-24 NOTE — Assessment & Plan Note (Signed)
She says that her blood pressures at home have looked great and they are really well-controlled on her current regimen.  Answered some questions in regards to her medication we did discuss how ACEs and ARB's are actually renally protected and we do need to make sure still following her renal function she will swing by to get updated blood work.

## 2024-01-24 NOTE — Assessment & Plan Note (Signed)
Continue current regimen with citalopram.

## 2024-02-01 ENCOUNTER — Ambulatory Visit: Payer: Self-pay

## 2024-02-09 ENCOUNTER — Ambulatory Visit: Payer: Self-pay

## 2024-06-04 ENCOUNTER — Ambulatory Visit: Payer: BC Managed Care – PPO | Admitting: Family Medicine

## 2024-06-18 ENCOUNTER — Ambulatory Visit (INDEPENDENT_AMBULATORY_CARE_PROVIDER_SITE_OTHER): Admitting: Family Medicine

## 2024-06-18 VITALS — BP 113/73 | HR 75 | Ht 65.0 in | Wt 177.1 lb

## 2024-06-18 DIAGNOSIS — I10 Essential (primary) hypertension: Secondary | ICD-10-CM | POA: Diagnosis not present

## 2024-06-18 DIAGNOSIS — E611 Iron deficiency: Secondary | ICD-10-CM

## 2024-06-18 DIAGNOSIS — Z23 Encounter for immunization: Secondary | ICD-10-CM

## 2024-06-18 DIAGNOSIS — Z7689 Persons encountering health services in other specified circumstances: Secondary | ICD-10-CM

## 2024-06-18 NOTE — Patient Instructions (Signed)
 If you start seeing BPS under 110, please let me know and we can decrease you r BP pill, or cut in half.

## 2024-06-18 NOTE — Assessment & Plan Note (Signed)
 She did decide to so ahead and start a compounded oral GLP-1 and is doing really well.  [facility]  Just encouraged her to keep an eye on her bowels and take a softener or extra fiber as needed.

## 2024-06-18 NOTE — Assessment & Plan Note (Signed)
 He was actually able to donate iron recently which is fantastic.

## 2024-06-18 NOTE — Progress Notes (Signed)
 Pt is taking compound Semaglutide.  Pt due for Tdap  She also requested that labs be done.

## 2024-06-18 NOTE — Assessment & Plan Note (Signed)
 Pressure looks fantastic today in fact it is getting a little bit on the lower end some going to have her monitor that at home if it starts going under 110 then we will need to adjust her blood pressure pill down especially as she continues to lose weight.  Monitor for any lightheadedness or dizziness.

## 2024-06-18 NOTE — Progress Notes (Signed)
   Established Patient Office Visit  Subjective  Patient ID: Lorraine Ayers, female    DOB: February 22, 1971  Age: 53 y.o. MRN: [id number]  Chief Complaint  Patient presents with   Hypertension    HPI  Hypertension- Pt denies chest pain, SOB, dizziness, or heart palpitations.  Taking meds as directed w/o problems.  Denies medication side effects.    Doing well and would like to continue with the celexa .  She feels like it really keeps her mind settled and she is doing well she would like continue with her current regimen.  Recently she did decide to go ahead and start compounded GLP-1 for weight loss she has been tolerating it well.  She is already lost some weight.     ROS    Objective:     BP 113/73   Pulse 75   Ht 5' 5 (1.651 m)   Wt 177 lb 1.3 oz (80.3 kg)   SpO2 99%   BMI 29.47 kg/m    Physical Exam Vitals and nursing note reviewed.  Constitutional:      Appearance: Normal appearance.  HENT:     Head: Normocephalic and atraumatic.  Eyes:     Conjunctiva/sclera: Conjunctivae normal.  Cardiovascular:     Rate and Rhythm: Normal rate and regular rhythm.  Pulmonary:     Effort: Pulmonary effort is normal.     Breath sounds: Normal breath sounds.  Skin:    General: Skin is warm and dry.  Neurological:     Mental Status: She is alert.  Psychiatric:        Mood and Affect: Mood normal.      No results found for any visits on 06/18/24.    The 10-year ASCVD risk score (Arnett DK, et al., 2019) is: 1.8%    Assessment & Plan:   Problem List Items Addressed This Visit       Cardiovascular and Mediastinum   Essential hypertension - Primary   Pressure looks fantastic today in fact it is getting a little bit on the lower end some going to have her monitor that at home if it starts going under 110 then we will need to adjust her blood pressure pill down especially as she continues to lose weight.  Monitor for any lightheadedness or dizziness.       Relevant Orders   TSH   Lipid Panel With LDL/HDL Ratio   HgB A1c   RFE85+ZHQM     Other   Low iron   He was actually able to donate iron recently which is fantastic.      Encounter for weight management   She did decide to so ahead and start a compounded oral GLP-1 and is doing really well.  [facility]  Just encouraged her to keep an eye on her bowels and take a softener or extra fiber as needed.      Other Visit Diagnoses       Encounter for immunization       Relevant Orders   Tdap vaccine greater than or equal to 7yo IM (Completed)       Get updated labs today.  Return in about 6 months (around 12/19/2024) for Hypertension, Mood.    Lorraine Byars, MD

## 2024-06-19 ENCOUNTER — Ambulatory Visit: Payer: Self-pay | Admitting: Family Medicine

## 2024-06-19 LAB — LIPID PANEL WITH LDL/HDL RATIO
Cholesterol, Total: 252 mg/dL — ABNORMAL HIGH (ref 100–199)
HDL: 67 mg/dL (ref >39)
LDL Chol Calc (NIH): 173 mg/dL — ABNORMAL HIGH (ref 0–99)
LDL/HDL Ratio: 2.6 ratio (ref 0.0–3.2)
Triglycerides: 70 mg/dL (ref 0–149)
VLDL Cholesterol Cal: 12 mg/dL (ref 5–40)

## 2024-06-19 LAB — CMP14+EGFR
ALT: 35 IU/L — ABNORMAL HIGH (ref 0–32)
AST: 24 IU/L (ref 0–40)
Albumin: 4.6 g/dL (ref 3.8–4.9)
Alkaline Phosphatase: 103 IU/L (ref 44–121)
BUN/Creatinine Ratio: 16 (ref 9–23)
BUN: 14 mg/dL (ref 6–24)
Bilirubin Total: 0.5 mg/dL (ref 0.0–1.2)
CO2: 22 mmol/L (ref 20–29)
Calcium: 9.6 mg/dL (ref 8.7–10.2)
Chloride: 93 mmol/L — ABNORMAL LOW (ref 96–106)
Creatinine, Ser: 0.87 mg/dL (ref 0.57–1.00)
Globulin, Total: 2.2 g/dL (ref 1.5–4.5)
Glucose: 97 mg/dL (ref 70–99)
Potassium: 4.2 mmol/L (ref 3.5–5.2)
Sodium: 133 mmol/L — ABNORMAL LOW (ref 134–144)
Total Protein: 6.8 g/dL (ref 6.0–8.5)
eGFR: 79 mL/min/1.73 (ref >59)

## 2024-06-19 LAB — TSH: TSH: 1.62 u[IU]/mL (ref 0.450–4.500)

## 2024-06-19 LAB — HEMOGLOBIN A1C
Est. average glucose Bld gHb Est-mCnc: 103 mg/dL
Hgb A1c MFr Bld: 5.2 % (ref 4.8–5.6)

## 2024-06-19 NOTE — Progress Notes (Signed)
 Hi Kathy, LDL is elevated at 173.  Goal is under 100.  Encouraged to continue to work on healthy Mediterranean diet and regular exercise.  For some reason the sodium and chloride level came back a little low I am not sure if this is a lab error.  I am not sure why this would be off so I would recommend just repeating that in a couple of weeks if possible.  And your ALT liver enzyme was also slightly up.  Have you had any type of GI issues or a stomach bug recently?  Your thyroid level looks perfect.  A1c looks fantastic at 5.2.  The 10-year ASCVD risk score (Arnett DK, et al., 2019) is: 2.2%   Values used to calculate the score:     Age: 46 years     Clincally relevant sex: Female     Is Non-Hispanic African American: No     Diabetic: No     Tobacco smoker: No     Systolic Blood Pressure: 113 mmHg     Is BP treated: Yes     HDL Cholesterol: 67 mg/dL     Total Cholesterol: 252 mg/dL

## 2024-07-27 ENCOUNTER — Other Ambulatory Visit: Payer: Self-pay | Admitting: Family Medicine

## 2024-07-27 DIAGNOSIS — I10 Essential (primary) hypertension: Secondary | ICD-10-CM

## 2024-10-30 ENCOUNTER — Encounter: Payer: Self-pay | Admitting: Family Medicine

## 2024-10-31 NOTE — Telephone Encounter (Signed)
 Patient chart shows that she was taking compounded semaglutide in July 2025 . However her medication list does not show that she is still on this medication.

## 2024-11-02 NOTE — Telephone Encounter (Signed)
 Yes, I can help. See if she is stillon the compounded.

## 2024-11-21 ENCOUNTER — Encounter: Payer: Self-pay | Admitting: Family Medicine

## 2024-11-21 MED ORDER — DOXYCYCLINE HYCLATE 100 MG PO TABS: 100.0000 mg | ORAL_TABLET | Freq: Two times a day (BID) | ORAL | 0 refills | Status: AC

## 2024-11-21 NOTE — Telephone Encounter (Signed)
 Pls schedule for same day tomorrow with me

## 2024-11-22 ENCOUNTER — Encounter: Payer: Self-pay | Admitting: Family Medicine

## 2024-11-22 ENCOUNTER — Ambulatory Visit: Admitting: Family Medicine

## 2024-11-22 VITALS — BP 131/89 | HR 79 | Ht 65.0 in | Wt 178.0 lb

## 2024-11-22 DIAGNOSIS — S71131A Puncture wound without foreign body, right thigh, initial encounter: Secondary | ICD-10-CM | POA: Diagnosis not present

## 2024-11-22 DIAGNOSIS — L02415 Cutaneous abscess of right lower limb: Secondary | ICD-10-CM

## 2024-11-22 DIAGNOSIS — I10 Essential (primary) hypertension: Secondary | ICD-10-CM

## 2024-11-22 NOTE — Progress Notes (Signed)
 Acute Office Visit  Patient ID: Lorraine Ayers, female    DOB: Sep 27, 1971, 53 y.o.   MRN: [id number]  PCP: Alvan Lorraine BIRCH, MD  Chief Complaint  Patient presents with   Wound Check    Subjective:     HPI  A board with two nails went into my right thigh. I cleaned it immediately and continued with soap and water. I massage my leg every night. It has healed in the punctures but I have a 3 hard spot that hurts to touch and is hot.   Discussed the use of AI scribe software for clinical note transcription with the patient, who gave verbal consent to proceed.  History of Present Illness Lorraine Ayers is a 53 year old female who presents with puncture wounds on her hand from nails.  Puncture wounds of the right anterior thigh  - Sustained puncture wounds on the hand from nails on November 29, after fell on board with nails in it - Wounds became itchy, hot, and swollen despite thorough washing. - No mention of drainage, fever, or systemic symptoms. - Cleans wounds with regular soap and water and pats them dry.  Immunization status - Tetanus immunization is up to date.  Antibiotic therapy - Oral antibiotics were called in to her pharmacy. - Plans to pick up antibiotics before returning to school for a Christmas performance.  Impact on daily activities - Works in a pre-K setting with a busy schedule. 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   ROS     Objective:    BP 131/89   Pulse 79   Ht 5' 5 (1.651 m)   Wt 178 lb (80.7 kg)   SpO2 99%   BMI 29.62 kg/m    Physical Exam Vitals reviewed.  Constitutional:      Appearance: Normal appearance.  Musculoskeletal:     Comments: Right inner thigh with bruising and area of erythema, heat and swelling.  Cleaned area with chorprep and gently removed the 2 scabs.  Pus under neath fist lesion, serous fluid under 2nd.    Neurological:     Mental Status: She is alert.   See phot under  mychart from yesterday     No results found for any visits on 11/22/24.     Assessment & Plan:   Problem List Items Addressed This Visit       Cardiovascular and Mediastinum   Essential hypertension   BP at goal.  Will refill meds.  Reschedule Jan appt for spring.        Relevant Medications   lisinopril -hydrochlorothiazide  (ZESTORETIC ) 20-25 MG tablet   Other Visit Diagnoses       Cutaneous abscess of right lower extremity    -  Primary     Puncture wound of right thigh, initial encounter           Assessment and Plan Assessment & Plan Cutaneous abscess and puncture wound of right lower extremity Puncture wound with infection signs. Differential includes staph infection and MRSA. Culture pending. - Cleaned wound with soap and water, pat dry. - Applied bandage. - Prescribed antibiotics for staph and MRSA coverage. - Monitor for changes in redness, heat, firmness over 24-48 hours. - Allow drainage. - Provided gauze for dressing changes. - Await culture results in three days, adjust antibiotics if needed.  Apply heat tonight to improve drainage and blood flow  Keep covered while drainage  Call if not improving.    Start doxy twice a day  Meds ordered this encounter  Medications   lisinopril -hydrochlorothiazide  (ZESTORETIC ) 20-25 MG tablet    Sig: Take 1 tablet by mouth daily.    Dispense:  90 tablet    Refill:  1    Return if symptoms worsen or fail to improve.  Lorraine Byars, MD [facility] at [facility]

## 2024-11-22 NOTE — Telephone Encounter (Signed)
 Spoke with patient. She has re-scheduled the appt and is going to be seen in office today at 11:30am with Dr. Alvan

## 2024-11-22 NOTE — Assessment & Plan Note (Signed)
 BP at goal.  Will refill meds.  Reschedule Jan appt for spring.

## 2024-11-22 NOTE — Patient Instructions (Signed)
 Apply heat tonight to improve drainage and blood flow  Keep covered while drainage  Call if not improving.    Start doxy twice a day

## 2024-11-26 ENCOUNTER — Ambulatory Visit: Admitting: Family Medicine

## 2024-11-26 ENCOUNTER — Ambulatory Visit: Payer: Self-pay | Admitting: Family Medicine

## 2024-11-26 NOTE — Progress Notes (Signed)
 Call patient: The is starting to grow out a bacteria but they have not finalized yet I just want to make sure that she feels like the redness is improving and that some of the swelling is going down.  Wanted to also see if there is still any drainage

## 2024-11-27 LAB — ANAEROBIC AND AEROBIC CULTURE

## 2024-11-27 MED ORDER — LEVOFLOXACIN 500 MG PO TABS: 500.0000 mg | ORAL_TABLET | Freq: Every day | ORAL | 0 refills | Status: AC

## 2024-11-27 NOTE — Progress Notes (Signed)
 HI Lorraine Ayers,   Based on your wound culture and get a need to send in a new antibiotic so once you get the new one placed start that 1 and you can stop the doxycycline .

## 2024-11-27 NOTE — Progress Notes (Signed)
 Ok keep doing warm cmpresses will look out for culture results in case we need to change ABX

## 2024-12-19 ENCOUNTER — Ambulatory Visit: Admitting: Family Medicine
